# Patient Record
Sex: Female | Born: 1955 | Race: Black or African American | Hispanic: No | Marital: Single | Smoking: Former smoker
Health system: Southern US, Community
[De-identification: ages and names within clinical notes are randomized; demographics above are authoritative.]

## PROBLEM LIST (undated history)

## (undated) DIAGNOSIS — J45909 Unspecified asthma, uncomplicated: Secondary | ICD-10-CM

## (undated) DIAGNOSIS — G4734 Idiopathic sleep related nonobstructive alveolar hypoventilation: Secondary | ICD-10-CM

## (undated) DIAGNOSIS — G4733 Obstructive sleep apnea (adult) (pediatric): Secondary | ICD-10-CM

## (undated) DIAGNOSIS — I10 Essential (primary) hypertension: Secondary | ICD-10-CM

## (undated) DIAGNOSIS — R0600 Dyspnea, unspecified: Secondary | ICD-10-CM

## (undated) DIAGNOSIS — J449 Chronic obstructive pulmonary disease, unspecified: Secondary | ICD-10-CM

## (undated) DIAGNOSIS — K439 Ventral hernia without obstruction or gangrene: Secondary | ICD-10-CM

## (undated) HISTORY — PX: UTERINE FIBROID SURGERY: SHX826

## (undated) HISTORY — PX: ABDOMINAL HYSTERECTOMY: SHX81

---

## 2011-07-27 ENCOUNTER — Ambulatory Visit: Payer: Medicaid Other | Attending: Family Medicine | Admitting: Physical Therapy

## 2011-10-29 ENCOUNTER — Encounter (HOSPITAL_COMMUNITY): Payer: Self-pay | Admitting: Emergency Medicine

## 2011-10-29 ENCOUNTER — Emergency Department (HOSPITAL_COMMUNITY)
Admission: EM | Admit: 2011-10-29 | Discharge: 2011-10-29 | Disposition: A | Discharge status: Home / Self Care | Payer: Medicaid Other | Attending: Emergency Medicine | Admitting: Emergency Medicine

## 2011-10-29 DIAGNOSIS — E669 Obesity, unspecified: Secondary | ICD-10-CM | POA: Insufficient documentation

## 2011-10-29 DIAGNOSIS — N390 Urinary tract infection, site not specified: Secondary | ICD-10-CM

## 2011-10-29 DIAGNOSIS — Z87891 Personal history of nicotine dependence: Secondary | ICD-10-CM | POA: Insufficient documentation

## 2011-10-29 DIAGNOSIS — Z880 Allergy status to penicillin: Secondary | ICD-10-CM | POA: Insufficient documentation

## 2011-10-29 DIAGNOSIS — I10 Essential (primary) hypertension: Secondary | ICD-10-CM | POA: Insufficient documentation

## 2011-10-29 DIAGNOSIS — B86 Scabies: Secondary | ICD-10-CM | POA: Insufficient documentation

## 2011-10-29 HISTORY — DX: Essential (primary) hypertension: I10

## 2011-10-29 HISTORY — DX: Unspecified asthma, uncomplicated: J45.909

## 2011-10-29 HISTORY — DX: Chronic obstructive pulmonary disease, unspecified: J44.9

## 2011-10-29 LAB — URINALYSIS, ROUTINE W REFLEX MICROSCOPIC
Glucose, UA: NEGATIVE mg/dL
Nitrite: POSITIVE — AB
Protein, ur: >300 mg/dL — AB

## 2011-10-29 LAB — URINE MICROSCOPIC-ADD ON

## 2011-10-29 MED ORDER — DIPHENHYDRAMINE HCL 25 MG PO TABS: 25.0000 mg | ORAL_TABLET | Freq: Four times a day (QID) | ORAL | Status: DC

## 2011-10-29 MED ORDER — IBUPROFEN 800 MG PO TABS
800.0000 mg | ORAL_TABLET | Freq: Once | ORAL | Status: AC
  Administered 2011-10-29: 800 mg via ORAL
  Filled 2011-10-29: qty 1

## 2011-10-29 MED ORDER — CIPROFLOXACIN HCL 500 MG PO TABS: 500.0000 mg | ORAL_TABLET | Freq: Two times a day (BID) | ORAL | Status: AC

## 2011-10-29 MED ORDER — PERMETHRIN 5 % EX CREA: TOPICAL_CREAM | CUTANEOUS | Status: AC

## 2011-10-29 NOTE — ED Provider Notes (Signed)
History     CSN: [id number]  Arrival date & time 10/29/11  1609   First MD Initiated Contact with Patient 10/29/11 1854      Chief Complaint  Patient presents with  . Dysuria  . Pruritis    (Consider location/radiation/quality/duration/timing/severity/associated sxs/prior treatment) HPI Comments: 56 y/o female presents with itching from "scabies" on and off since February. States she was living in a shelter infested with scabies at the time, was treated and resolved, but has been seeing a man from the shelter who also has scabies. She randomly gets itchy in areas throughout her body including her hands, feet, web spaces, legs, chest, arms. States she sees a "pimple-like thing", scratches it, and continues to do this throughout the day. Also admits to dysuria with associated left sided low back pain, slight pelvic pressure and urgency for the past week. Denies any hematuria, increased frequency, fever, chills, vaginal pain, itching, discharge or odor, chest pain, sob.   Patient is a 56 y.o. female presenting with dysuria. The history is provided by the patient.  Dysuria  Associated symptoms include urgency. Pertinent negatives include no chills, no nausea, no vomiting, no frequency and no hematuria.    Past Medical History  Diagnosis Date  . Asthma   . COPD (chronic obstructive pulmonary disease)   . Hypertension     Past Surgical History  Procedure Date  . Abdominal hysterectomy   . Uterine fibroid surgery     No family history on file.  History  Substance Use Topics  . Smoking status: Former Games developer  . Smokeless tobacco: Not on file   Comment: quit 2008  . Alcohol Use: No    OB History    Grav Para Term Preterm Abortions TAB SAB Ect Mult Living                  Review of Systems  Constitutional: Negative for fever and chills.  Respiratory: Negative for shortness of breath.   Cardiovascular: Negative for chest pain.  Gastrointestinal: Negative for nausea and  vomiting.  Genitourinary: Positive for dysuria and urgency. Negative for frequency, hematuria, vaginal discharge and vaginal pain. Pelvic pain: pressure.  Musculoskeletal: Positive for back pain.  Skin:       Positive for itching    Allergies  Penicillins  Home Medications  No current outpatient prescriptions on file.  BP 151/68  Pulse 95  Temp 98.7 F (37.1 C) (Oral)  Resp 16  SpO2 97%  Physical Exam  Constitutional: She is oriented to person, place, and time. No distress.       Morbidly obese  HENT:  Head: Normocephalic and atraumatic.  Mouth/Throat: Oropharynx is clear and moist and mucous membranes are normal.  Eyes: Conjunctivae are normal. Pupils are equal, round, and reactive to light.  Neck: Neck supple.  Cardiovascular: Normal rate, regular rhythm and normal heart sounds.   Pulmonary/Chest: Effort normal and breath sounds normal.  Abdominal: Soft. Bowel sounds are normal. There is tenderness in the suprapubic area. There is no rigidity, no rebound and no guarding. CVA tenderness: mild on left.  Neurological: She is alert and oriented to person, place, and time.  Skin: Skin is warm and dry.       Multiple excoriations present on hands, arms, and legs from patient scratching. Few burrows present in web spaces of right hand. No evidence of secondary infection.  Psychiatric: She has a normal mood and affect. Her speech is normal and behavior is normal.    ED  Course  Procedures (including critical care time)   Labs Reviewed  URINALYSIS, ROUTINE W REFLEX MICROSCOPIC   Results for orders placed during the hospital encounter of 10/29/11  URINALYSIS, ROUTINE W REFLEX MICROSCOPIC      Component Value Range   Color, Urine YELLOW  YELLOW   APPearance CLOUDY (*) CLEAR   Specific Gravity, Urine 1.027  1.005 - 1.030   pH 6.0  5.0 - 8.0   Glucose, UA NEGATIVE  NEGATIVE mg/dL   Hgb urine dipstick MODERATE (*) NEGATIVE   Bilirubin Urine NEGATIVE  NEGATIVE   Ketones, ur  NEGATIVE  NEGATIVE mg/dL   Protein, ur >102 (*) NEGATIVE mg/dL   Urobilinogen, UA 1.0  0.0 - 1.0 mg/dL   Nitrite POSITIVE (*) NEGATIVE   Leukocytes, UA MODERATE (*) NEGATIVE  URINE MICROSCOPIC-ADD ON      Component Value Range   Squamous Epithelial / LPF MANY (*) RARE   WBC, UA 21-50  <3 WBC/hpf   RBC / HPF 11-20  <3 RBC/hpf   Bacteria, UA MANY (*) RARE   Casts HYALINE CASTS (*) NEGATIVE   Urine-Other MUCOUS PRESENT      No results found.   1. Scabies   2. Urinary tract infection       MDM  56 y/o female with scabies and UTI. Instructions to thoroughly clean all linens and clothing given. Rx permethrin and cipro.        Trevor Mace, PA-C 10/29/11 1950

## 2011-10-29 NOTE — ED Notes (Addendum)
Pt reports dsyuria, and lower back pain and lower abd pain; pt reports having itching, and hx of having scabies---reports feels the same as when she had scabies before; pt also reports having unprotected sex and having vaginal pain; denies abn. Discharge; no rash noted on pt

## 2011-10-29 NOTE — ED Notes (Signed)
Spoke with Johnnette Gourd PA in CDU, agreed to see pt in CDU

## 2011-10-29 NOTE — ED Notes (Signed)
Spoke with Dr Manus Gunning about pt--reports not candidate for FT d/t possible pelvic and would treat pt as scabies d/t hx of same and similar symptoms, and having sexual relations with man from homeless shelter

## 2011-10-30 NOTE — ED Provider Notes (Signed)
Medical screening examination/treatment/procedure(s) were performed by non-physician practitioner and as supervising physician I was immediately available for consultation/collaboration.  Merik Mignano, MD 10/30/11 0232 

## 2012-02-23 ENCOUNTER — Other Ambulatory Visit: Payer: Self-pay | Admitting: Internal Medicine

## 2012-02-23 DIAGNOSIS — Z1231 Encounter for screening mammogram for malignant neoplasm of breast: Secondary | ICD-10-CM

## 2012-03-10 ENCOUNTER — Other Ambulatory Visit: Payer: Self-pay | Admitting: Obstetrics and Gynecology

## 2012-03-10 ENCOUNTER — Ambulatory Visit: Payer: Medicaid Other

## 2012-04-07 ENCOUNTER — Ambulatory Visit: Payer: Medicaid Other

## 2012-04-28 ENCOUNTER — Encounter (HOSPITAL_COMMUNITY): Payer: Self-pay | Admitting: Cardiology

## 2012-04-28 ENCOUNTER — Emergency Department (HOSPITAL_COMMUNITY)
Admission: EM | Admit: 2012-04-28 | Discharge: 2012-04-28 | Disposition: A | Discharge status: Home / Self Care | Payer: Medicaid Other | Attending: Emergency Medicine | Admitting: Emergency Medicine

## 2012-04-28 ENCOUNTER — Emergency Department (HOSPITAL_COMMUNITY): Payer: Medicaid Other

## 2012-04-28 DIAGNOSIS — B86 Scabies: Secondary | ICD-10-CM | POA: Insufficient documentation

## 2012-04-28 DIAGNOSIS — J4489 Other specified chronic obstructive pulmonary disease: Secondary | ICD-10-CM | POA: Insufficient documentation

## 2012-04-28 DIAGNOSIS — Z87891 Personal history of nicotine dependence: Secondary | ICD-10-CM | POA: Insufficient documentation

## 2012-04-28 DIAGNOSIS — J449 Chronic obstructive pulmonary disease, unspecified: Secondary | ICD-10-CM | POA: Insufficient documentation

## 2012-04-28 DIAGNOSIS — I10 Essential (primary) hypertension: Secondary | ICD-10-CM | POA: Insufficient documentation

## 2012-04-28 DIAGNOSIS — L299 Pruritus, unspecified: Secondary | ICD-10-CM | POA: Insufficient documentation

## 2012-04-28 DIAGNOSIS — J45909 Unspecified asthma, uncomplicated: Secondary | ICD-10-CM | POA: Insufficient documentation

## 2012-04-28 DIAGNOSIS — R21 Rash and other nonspecific skin eruption: Secondary | ICD-10-CM | POA: Insufficient documentation

## 2012-04-28 DIAGNOSIS — R0600 Dyspnea, unspecified: Secondary | ICD-10-CM

## 2012-04-28 LAB — CBC
HCT: 36.8 % (ref 36.0–46.0)
Hemoglobin: 12.4 g/dL (ref 12.0–15.0)
MCH: 30.1 pg (ref 26.0–34.0)
MCV: 89.3 fL (ref 78.0–100.0)
Platelets: 204 10*3/uL (ref 150–400)
RBC: 4.12 MIL/uL (ref 3.87–5.11)

## 2012-04-28 LAB — BASIC METABOLIC PANEL
BUN: 14 mg/dL (ref 6–23)
CO2: 29 mEq/L (ref 19–32)
Calcium: 8.9 mg/dL (ref 8.4–10.5)
Creatinine, Ser: 0.95 mg/dL (ref 0.50–1.10)
Glucose, Bld: 93 mg/dL (ref 70–99)

## 2012-04-28 MED ORDER — HYDROXYZINE HCL 25 MG PO TABS: 25.0000 mg | ORAL_TABLET | Freq: Four times a day (QID) | ORAL | Status: DC

## 2012-04-28 MED ORDER — PERMETHRIN 5 % EX CREA
TOPICAL_CREAM | CUTANEOUS | Status: DC
Start: 2012-04-28 — End: 2012-06-24

## 2012-04-28 MED ORDER — ALBUTEROL SULFATE HFA 108 (90 BASE) MCG/ACT IN AERS: 2.0000 | INHALATION_SPRAY | Freq: Four times a day (QID) | RESPIRATORY_TRACT | Status: DC | PRN

## 2012-04-28 NOTE — ED Notes (Signed)
Pt reports SOB with hx of asthma that started this morning when she was walking to the car. Denies any current chest pain. Reports she used her inhaler without relief this morning. Reports she was dx with scabies about a year ago.

## 2012-04-28 NOTE — ED Provider Notes (Signed)
History     CSN: [id number]  Arrival date & time 04/28/12  4098   First MD Initiated Contact with Patient 04/28/12 1128      Chief Complaint  Patient presents with  . Shortness of Breath    (Consider location/radiation/quality/duration/timing/severity/associated sxs/prior treatment) HPI Comments: Patient states that she primarily comes to the Emergency Department due to concern that she may have Scabies.  She reports that both her niece and nephew who live with her were diagnosed with scabies last week.  She has been having intense itching of her elbows, back, groin, axilla, web spaces of the finger, and elbows.  She has noticed a rash on both of her elbows.  She has not tried any treatment prior to arrival.  She also states that she has a history of Asthma and has been feeling mildly short of breath.  She states that she has been feeling this way for months.  She has been told that this is most likely due to her morbid obesity.  She also has a history of asthma.  She states that she ran out of her Albuterol inhaler.  She did use her Advair inhaler this morning.  She currently does not smoke.  She denies any chest pain.  She states that she does not feel short of breath, but does become mildly short of breath with exertion.  She states that her shortness of breath does not feel any different today than it has over the past several months and that she primarily came in today because of concern for Scabies.  She denies fever or chills.  Denies cough or hemoptysis.  Denies wheezing.  Denies prolonged travel or surgeries in the past 4 weeks.  Denies any prior history of DVT or PE.  Denies swelling or pain of the LE bilaterally.    The history is provided by the patient.    Past Medical History  Diagnosis Date  . Asthma   . COPD (chronic obstructive pulmonary disease)   . Hypertension     Past Surgical History  Procedure Laterality Date  . Abdominal hysterectomy    . Uterine fibroid surgery       History reviewed. No pertinent family history.  History  Substance Use Topics  . Smoking status: Former Games developer  . Smokeless tobacco: Not on file     Comment: quit 2008  . Alcohol Use: No    OB History   Grav Para Term Preterm Abortions TAB SAB Ect Mult Living                  Review of Systems  Constitutional: Negative for fever and chills.  Respiratory: Positive for shortness of breath. Negative for cough and wheezing.   Cardiovascular: Negative for chest pain, palpitations and leg swelling.  Skin: Positive for rash.  All other systems reviewed and are negative.    Allergies  Penicillins  Home Medications   Current Outpatient Rx  Name  Route  Sig  Dispense  Refill  . EXPIRED: diphenhydrAMINE (BENADRYL) 25 MG tablet   Oral   Take 1 tablet (25 mg total) by mouth every 6 (six) hours.   20 tablet   0     BP 177/92  Pulse 59  Temp(Src) 97.7 F (36.5 C) (Oral)  SpO2 100%  Physical Exam  Nursing note and vitals reviewed. Constitutional: She appears well-developed and well-nourished. No distress.  Morbidly obese  HENT:  Head: Normocephalic and atraumatic.  Mouth/Throat: Oropharynx is clear and moist.  Neck: Normal range of motion. Neck supple.  Cardiovascular: Normal rate, regular rhythm and normal heart sounds.   Pulmonary/Chest: Effort normal and breath sounds normal. No accessory muscle usage. Not tachypneic. No respiratory distress. She has no decreased breath sounds. She has no wheezes. She has no rhonchi. She has no rales.  Musculoskeletal: Normal range of motion.  Neurological: She is alert.  Skin: Skin is warm and dry. Rash noted. She is not diaphoretic.  Erythematous papular pruritic rash located on the elbows bilaterally  Psychiatric: She has a normal mood and affect.    ED Course  Procedures (including critical care time)  Labs Reviewed  BASIC METABOLIC PANEL - Abnormal; Notable for the following:    GFR calc non Af Amer 67 (*)    GFR  calc Af Amer 77 (*)    All other components within normal limits  CBC  POCT I-STAT TROPONIN I   Dg Chest 2 View  04/28/2012  *RADIOLOGY REPORT*  Clinical Data: Shortness of breath, cough and asthma.  CHEST - 2 VIEW  Comparison: None.  Findings: Trachea is midline.  Heart is enlarged.  Pulmonary arteries appear prominent as well.  Lungs are clear.  No pleural fluid.  IMPRESSION: No acute findings.   Original Report Authenticated By: Leanna Battles, M.D.      No diagnosis found.  Date: 04/29/2012  Rate: 58  Rhythm: sinus bradycardia  QRS Axis: left  Intervals: normal  ST/T Wave abnormalities: nonspecific T wave changes  Conduction Disutrbances:none  Narrative Interpretation:   Old EKG Reviewed: none available     MDM  Patient with recent exposure to scabies presents today with a pruritic rash in the typical distribution of scabies.  Patient treated with Permethrin Cream.  She is also having some DOE.  However, this is nothing acute today.  She reports that she has been feeling this way for months and attributes it to her morbid obesity.  She primarily came to the ED for the rash.  No respiratory distress.  Pulse ox 100 on RA.  No tachypnea or tachycardia.  No chest pain.  Lungs CTAB.  No acute findings on CXR.  No ischemic changes on EKG.  Troponin negative.  Labs unremarkable.  Therefore, feel that patient is stable for discharge.  Strict return precautions given to the patient.          Pascal Lux [place], PA-C 04/29/12 1214

## 2012-05-02 NOTE — ED Provider Notes (Signed)
Medical screening examination/treatment/procedure(s) were performed by non-physician practitioner and as supervising physician I was immediately available for consultation/collaboration.   Shelda Jakes, MD 05/02/12 1154

## 2012-06-24 ENCOUNTER — Emergency Department (HOSPITAL_COMMUNITY): Payer: Medicaid Other

## 2012-06-24 ENCOUNTER — Encounter (HOSPITAL_COMMUNITY): Payer: Self-pay | Admitting: *Deleted

## 2012-06-24 ENCOUNTER — Emergency Department (HOSPITAL_COMMUNITY)
Admission: EM | Admit: 2012-06-24 | Discharge: 2012-06-24 | Disposition: A | Discharge status: Home / Self Care | Payer: Medicaid Other | Attending: Emergency Medicine | Admitting: Emergency Medicine

## 2012-06-24 DIAGNOSIS — R197 Diarrhea, unspecified: Secondary | ICD-10-CM | POA: Insufficient documentation

## 2012-06-24 DIAGNOSIS — J029 Acute pharyngitis, unspecified: Secondary | ICD-10-CM | POA: Insufficient documentation

## 2012-06-24 DIAGNOSIS — M791 Myalgia, unspecified site: Secondary | ICD-10-CM

## 2012-06-24 DIAGNOSIS — R05 Cough: Secondary | ICD-10-CM | POA: Insufficient documentation

## 2012-06-24 DIAGNOSIS — R0602 Shortness of breath: Secondary | ICD-10-CM

## 2012-06-24 DIAGNOSIS — IMO0002 Reserved for concepts with insufficient information to code with codable children: Secondary | ICD-10-CM | POA: Insufficient documentation

## 2012-06-24 DIAGNOSIS — J3489 Other specified disorders of nose and nasal sinuses: Secondary | ICD-10-CM | POA: Insufficient documentation

## 2012-06-24 DIAGNOSIS — Z87891 Personal history of nicotine dependence: Secondary | ICD-10-CM | POA: Insufficient documentation

## 2012-06-24 DIAGNOSIS — I10 Essential (primary) hypertension: Secondary | ICD-10-CM | POA: Insufficient documentation

## 2012-06-24 DIAGNOSIS — Z79899 Other long term (current) drug therapy: Secondary | ICD-10-CM | POA: Insufficient documentation

## 2012-06-24 DIAGNOSIS — J441 Chronic obstructive pulmonary disease with (acute) exacerbation: Secondary | ICD-10-CM | POA: Insufficient documentation

## 2012-06-24 DIAGNOSIS — R059 Cough, unspecified: Secondary | ICD-10-CM | POA: Insufficient documentation

## 2012-06-24 DIAGNOSIS — J45901 Unspecified asthma with (acute) exacerbation: Secondary | ICD-10-CM | POA: Insufficient documentation

## 2012-06-24 DIAGNOSIS — IMO0001 Reserved for inherently not codable concepts without codable children: Secondary | ICD-10-CM | POA: Insufficient documentation

## 2012-06-24 LAB — BASIC METABOLIC PANEL
BUN: 14 mg/dL (ref 6–23)
CO2: 31 mEq/L (ref 19–32)
Chloride: 101 mEq/L (ref 96–112)
Creatinine, Ser: 0.99 mg/dL (ref 0.50–1.10)
Potassium: 3.6 mEq/L (ref 3.5–5.1)

## 2012-06-24 LAB — POCT I-STAT, CHEM 8
BUN: 13 mg/dL (ref 6–23)
Hemoglobin: 14.3 g/dL (ref 12.0–15.0)
Potassium: 3.7 mEq/L (ref 3.5–5.1)
Sodium: 140 mEq/L (ref 135–145)
TCO2: 29 mmol/L (ref 0–100)

## 2012-06-24 LAB — CBC
HCT: 39.3 % (ref 36.0–46.0)
Hemoglobin: 13.6 g/dL (ref 12.0–15.0)
MCHC: 34.6 g/dL (ref 30.0–36.0)
MCV: 88.1 fL (ref 78.0–100.0)
RDW: 13.2 % (ref 11.5–15.5)

## 2012-06-24 MED ORDER — ALBUTEROL SULFATE (5 MG/ML) 0.5% IN NEBU
5.0000 mg | INHALATION_SOLUTION | Freq: Once | RESPIRATORY_TRACT | Status: AC
  Administered 2012-06-24: 5 mg via RESPIRATORY_TRACT
  Filled 2012-06-24: qty 1

## 2012-06-24 MED ORDER — BENZONATATE 100 MG PO CAPS
200.0000 mg | ORAL_CAPSULE | Freq: Once | ORAL | Status: AC
  Administered 2012-06-24: 200 mg via ORAL
  Filled 2012-06-24: qty 2

## 2012-06-24 MED ORDER — DIPHENOXYLATE-ATROPINE 2.5-0.025 MG PO TABS: 1.0000 | ORAL_TABLET | Freq: Four times a day (QID) | ORAL | Status: DC | PRN

## 2012-06-24 MED ORDER — ALBUTEROL SULFATE (5 MG/ML) 0.5% IN NEBU: 5.0000 mg | INHALATION_SOLUTION | Freq: Once | RESPIRATORY_TRACT | Status: AC

## 2012-06-24 MED ORDER — ONDANSETRON 4 MG PO TBDP: 4.0000 mg | ORAL_TABLET | Freq: Three times a day (TID) | ORAL | Status: DC | PRN

## 2012-06-24 MED ORDER — KETOROLAC TROMETHAMINE 60 MG/2ML IM SOLN
60.0000 mg | Freq: Once | INTRAMUSCULAR | Status: AC
  Administered 2012-06-24: 60 mg via INTRAMUSCULAR
  Filled 2012-06-24: qty 2

## 2012-06-24 MED ORDER — NAPROXEN 500 MG PO TABS: 500.0000 mg | ORAL_TABLET | Freq: Two times a day (BID) | ORAL | Status: DC

## 2012-06-24 MED ORDER — KETOROLAC TROMETHAMINE 30 MG/ML IJ SOLN
30.0000 mg | Freq: Once | INTRAMUSCULAR | Status: DC
  Filled 2012-06-24: qty 1

## 2012-06-24 MED ORDER — SODIUM CHLORIDE 0.9 % IV BOLUS (SEPSIS)
1000.0000 mL | INTRAVENOUS | Status: DC
Start: 2012-06-24 — End: 2012-06-25

## 2012-06-24 NOTE — ED Provider Notes (Signed)
[place] S 8:00 PM patient discussed in sign out with Dr. Hyacinth Meeker. Patient with history of COPD presenting with shortness of breath and cough. Unremarkable vital signs. Patient responding with good improvements to the initial breathing treatments in the emergency room. Basic labs pending. If unremarkable patient continues to be in stable condition may discharge home.  Labs and unremarkable. Patient continues to appear stable and improved after treatments previous in the emergency room. She is ready to return home. Will discharge at this time.    Results for orders placed during the hospital encounter of 06/24/12  BASIC METABOLIC PANEL      Result Value Range   Sodium 138  135 - 145 mEq/L   Potassium 3.6  3.5 - 5.1 mEq/L   Chloride 101  96 - 112 mEq/L   CO2 31  19 - 32 mEq/L   Glucose, Bld 104 (*) 70 - 99 mg/dL   BUN 14  6 - 23 mg/dL   Creatinine, Ser 1.61  0.50 - 1.10 mg/dL   Calcium 9.3  8.4 - 09.6 mg/dL   GFR calc non Af Amer 63 (*) >90 mL/min   GFR calc Af Amer 74 (*) >90 mL/min  CBC      Result Value Range   WBC 7.2  4.0 - 10.5 K/uL   RBC 4.46  3.87 - 5.11 MIL/uL   Hemoglobin 13.6  12.0 - 15.0 g/dL   HCT 04.5  40.9 - 81.1 %   MCV 88.1  78.0 - 100.0 fL   MCH 30.5  26.0 - 34.0 pg   MCHC 34.6  30.0 - 36.0 g/dL   RDW 91.4  78.2 - 95.6 %   Platelets 204  150 - 400 K/uL  POCT I-STAT, CHEM 8      Result Value Range   Sodium 140  135 - 145 mEq/L   Potassium 3.7  3.5 - 5.1 mEq/L   Chloride 103  96 - 112 mEq/L   BUN 13  6 - 23 mg/dL   Creatinine, Ser 2.13  0.50 - 1.10 mg/dL   Glucose, Bld 086 (*) 70 - 99 mg/dL   Calcium, Ion 5.78  4.69 - 1.23 mmol/L   TCO2 29  0 - 100 mmol/L   Hemoglobin 14.3  12.0 - 15.0 g/dL   HCT 62.9  52.8 - 41.3 %     Angus Seller, PA-C 06/25/12 [phone]

## 2012-06-24 NOTE — ED Provider Notes (Signed)
History     CSN: [id number]  Arrival date & time 06/24/12  4098   First MD Initiated Contact with Patient 06/24/12 1836      Chief Complaint  Patient presents with  . Shortness of Breath    (Consider location/radiation/quality/duration/timing/severity/associated sxs/prior treatment) HPI Comments: 57 year old female with a history of asthma and hypertension and morbid obesity who presents with a complaint of cough and shortness of breath. She states this has been present for approximately 3 days, gradual in onset, gradually getting worse and now associated with watery diarrhea, body aches, sore throat and nasal congestion. She denies fevers or chills and denies dysuria swelling or rashes of the skin. She has tried her albuterol inhaler at home with no improvement. She denies sick contacts  Patient is a 57 y.o. female presenting with shortness of breath. The history is provided by the patient.  Shortness of Breath   Past Medical History  Diagnosis Date  . Asthma   . COPD (chronic obstructive pulmonary disease)   . Hypertension     Past Surgical History  Procedure Laterality Date  . Abdominal hysterectomy    . Uterine fibroid surgery      No family history on file.  History  Substance Use Topics  . Smoking status: Former Games developer  . Smokeless tobacco: Not on file     Comment: quit 2008  . Alcohol Use: No    OB History   Grav Para Term Preterm Abortions TAB SAB Ect Mult Living                  Review of Systems  Respiratory: Positive for shortness of breath.   All other systems reviewed and are negative.    Allergies  Penicillins  Home Medications   Current Outpatient Rx  Name  Route  Sig  Dispense  Refill  . acetaminophen (TYLENOL) 325 MG tablet   Oral   Take 650 mg by mouth 2 (two) times daily as needed for pain.         Marland Kitchen albuterol (PROVENTIL HFA;VENTOLIN HFA) 108 (90 BASE) MCG/ACT inhaler   Inhalation   Inhale 2 puffs into the lungs every 6 (six)  hours as needed for wheezing.         Marland Kitchen Dextromethorphan-Guaifenesin (CORICIDIN HBP CONGESTION/COUGH) 10-200 MG CAPS   Oral   Take 1 capsule by mouth at bedtime as needed. For cold symptoms         . estrogen, conjugated,-medroxyprogesterone (PREMPRO) 0.625-2.5 MG per tablet   Oral   Take 1 tablet by mouth daily.         . Fluticasone-Salmeterol (ADVAIR) 250-50 MCG/DOSE AEPB   Inhalation   Inhale 1 puff into the lungs daily.         Marland Kitchen guaifenesin (ROBITUSSIN) 100 MG/5ML syrup   Oral   Take 200 mg by mouth 3 (three) times daily as needed for cough.         . hydrOXYzine (ATARAX/VISTARIL) 25 MG tablet   Oral   Take 25 mg by mouth every 6 (six) hours.         Marland Kitchen lisinopril (PRINIVIL,ZESTRIL) 40 MG tablet   Oral   Take 40 mg by mouth daily.         Marland Kitchen omeprazole (PRILOSEC) 40 MG capsule   Oral   Take 40 mg by mouth daily.         Marland Kitchen oxyCODONE-acetaminophen (PERCOCET) 10-325 MG per tablet   Oral   Take 1 tablet by  mouth every 4 (four) hours as needed for pain.         . diphenoxylate-atropine (LOMOTIL) 2.5-0.025 MG per tablet   Oral   Take 1 tablet by mouth 4 (four) times daily as needed for diarrhea or loose stools.   30 tablet   0   . naproxen (NAPROSYN) 500 MG tablet   Oral   Take 1 tablet (500 mg total) by mouth 2 (two) times daily with a meal.   30 tablet   0   . ondansetron (ZOFRAN ODT) 4 MG disintegrating tablet   Oral   Take 1 tablet (4 mg total) by mouth every 8 (eight) hours as needed for nausea.   10 tablet   0     BP 140/67  Pulse 70  Temp(Src) 99.3 F (37.4 C) (Oral)  Resp 21  SpO2 96%  Physical Exam  Nursing note and vitals reviewed. Constitutional: She appears well-developed and well-nourished. No distress.  HENT:  Head: Normocephalic and atraumatic.  Mouth/Throat: Oropharynx is clear and moist. No oropharyngeal exudate.  Pharynx is clear, tympanic membranes are clear bilaterally, nasal passages show swollen turbinates with  clear rhinorrhea  Eyes: Conjunctivae and EOM are normal. Pupils are equal, round, and reactive to light. Right eye exhibits no discharge. Left eye exhibits no discharge. No scleral icterus.  Neck: Normal range of motion. Neck supple. No JVD present. No thyromegaly present.  Supple neck, no lymphadenopathy or thyromegaly, no meningismus or stiffness  Cardiovascular: Normal rate, regular rhythm, normal heart sounds and intact distal pulses.  Exam reveals no gallop and no friction rub.   No murmur heard. Pulmonary/Chest: Effort normal and breath sounds normal. No respiratory distress. She has no wheezes. She has no rales.  Respiratory rate of 20, no wheezing, no rales, speaks in full sentences, no accessory muscle use, no apparent increased work of breathing  Abdominal: Soft. Bowel sounds are normal. She exhibits no distension and no mass. There is no tenderness.  Musculoskeletal: Normal range of motion. She exhibits no edema and no tenderness.  Lymphadenopathy:    She has no cervical adenopathy.  Neurological: She is alert. Coordination normal.  Skin: Skin is warm and dry. No rash noted. No erythema.  Psychiatric: She has a normal mood and affect. Her behavior is normal.    ED Course  Procedures (including critical care time)  Labs Reviewed  BASIC METABOLIC PANEL - Abnormal; Notable for the following:    Glucose, Bld 104 (*)    GFR calc non Af Amer 63 (*)    GFR calc Af Amer 74 (*)    All other components within normal limits  POCT I-STAT, CHEM 8 - Abnormal; Notable for the following:    Glucose, Bld 101 (*)    All other components within normal limits  CBC   Dg Chest 2 View (if Patient Has Fever And/or Copd)  06/24/2012  *RADIOLOGY REPORT*  Clinical Data: Shortness of breath  CHEST - 2 VIEW  Comparison: 04/28/2012  Findings: Chronic interstitial markings, including more focal opacity/scarring in the right upper lobe.  No pleural effusion or pneumothorax.  Cardiomegaly.  Mild  degenerative changes of the visualized thoracolumbar spine.  IMPRESSION: No evidence of acute cardiopulmonary disease.   Original Report Authenticated By: Charline Bills, M.D.      1. SOB (shortness of breath)   2. Diarrhea   3. Myalgia       MDM  The patient does appear to have some shortness of breath though her oxygen  saturations are normal, she is coughing occasionally, not tachycardic and not febrile. At this time the patient will undergo chest x-ray and laboratory workup though I suspect a viral process given multisystem involvement with overall stable vital signs. Toradol, IV fluids, reevaluate  ED ECG REPORT  I personally interpreted this EKG   Date: 06/24/2012   Rate: 71  Rhythm: sinus bradycardia  QRS Axis: left  Intervals: QRS prolonged  ST/T Wave abnormalities: normal  Conduction Disutrbances:nonspecific intraventricular conduction delay  Narrative Interpretation:   Old EKG Reviewed: Compared with 04/28/2012, no significant changes   Labs normal, pt stable appearing, viral syndrome likely.  Vida Roller, MD 06/24/12 [phone]

## 2012-06-24 NOTE — ED Notes (Signed)
Pt with cough, chills for several days.  Breathing x 3 with no relief.

## 2012-06-24 NOTE — ED Notes (Signed)
Pt given discharge paperwork.  No additional questions by pt regarding d/c.  VSS. Resps e/u. E-signature obtained.

## 2012-06-24 NOTE — ED Notes (Signed)
Attempted IV Access x2.  MD made aware.

## 2012-06-26 NOTE — ED Provider Notes (Signed)
Medical screening examination/treatment/procedure(s) were performed by non-physician practitioner and as supervising physician I was immediately available for consultation/collaboration.   Dione Booze, MD 06/26/12 [phone]

## 2012-11-13 ENCOUNTER — Encounter (HOSPITAL_COMMUNITY): Payer: Self-pay | Admitting: *Deleted

## 2012-11-13 ENCOUNTER — Emergency Department (HOSPITAL_COMMUNITY)
Admission: EM | Admit: 2012-11-13 | Discharge: 2012-11-13 | Disposition: A | Discharge status: Home / Self Care | Payer: Medicaid Other | Attending: Emergency Medicine | Admitting: Emergency Medicine

## 2012-11-13 DIAGNOSIS — Z880 Allergy status to penicillin: Secondary | ICD-10-CM | POA: Insufficient documentation

## 2012-11-13 DIAGNOSIS — M545 Low back pain, unspecified: Secondary | ICD-10-CM | POA: Insufficient documentation

## 2012-11-13 DIAGNOSIS — J4489 Other specified chronic obstructive pulmonary disease: Secondary | ICD-10-CM | POA: Insufficient documentation

## 2012-11-13 DIAGNOSIS — N390 Urinary tract infection, site not specified: Secondary | ICD-10-CM

## 2012-11-13 DIAGNOSIS — J449 Chronic obstructive pulmonary disease, unspecified: Secondary | ICD-10-CM | POA: Insufficient documentation

## 2012-11-13 DIAGNOSIS — Z87891 Personal history of nicotine dependence: Secondary | ICD-10-CM | POA: Insufficient documentation

## 2012-11-13 DIAGNOSIS — R300 Dysuria: Secondary | ICD-10-CM | POA: Insufficient documentation

## 2012-11-13 DIAGNOSIS — Z79899 Other long term (current) drug therapy: Secondary | ICD-10-CM | POA: Insufficient documentation

## 2012-11-13 DIAGNOSIS — I10 Essential (primary) hypertension: Secondary | ICD-10-CM | POA: Insufficient documentation

## 2012-11-13 DIAGNOSIS — R350 Frequency of micturition: Secondary | ICD-10-CM | POA: Insufficient documentation

## 2012-11-13 LAB — URINE MICROSCOPIC-ADD ON

## 2012-11-13 LAB — COMPREHENSIVE METABOLIC PANEL
ALT: 19 U/L (ref 0–35)
Alkaline Phosphatase: 88 U/L (ref 39–117)
CO2: 29 mEq/L (ref 19–32)
Chloride: 102 mEq/L (ref 96–112)
GFR calc Af Amer: 74 mL/min — ABNORMAL LOW (ref >90)
GFR calc non Af Amer: 64 mL/min — ABNORMAL LOW (ref >90)
Glucose, Bld: 110 mg/dL — ABNORMAL HIGH (ref 70–99)
Potassium: 3.1 mEq/L — ABNORMAL LOW (ref 3.5–5.1)
Sodium: 140 mEq/L (ref 135–145)

## 2012-11-13 LAB — URINALYSIS, ROUTINE W REFLEX MICROSCOPIC
Ketones, ur: 15 mg/dL — AB
Nitrite: POSITIVE — AB
Urobilinogen, UA: 2 mg/dL — ABNORMAL HIGH (ref 0.0–1.0)
pH: 6 (ref 5.0–8.0)

## 2012-11-13 LAB — CBC WITH DIFFERENTIAL/PLATELET
Lymphocytes Relative: 27 % (ref 12–46)
Lymphs Abs: 2 10*3/uL (ref 0.7–4.0)
Neutrophils Relative %: 62 % (ref 43–77)
Platelets: 237 10*3/uL (ref 150–400)
RBC: 4.06 MIL/uL (ref 3.87–5.11)
WBC: 7.3 10*3/uL (ref 4.0–10.5)

## 2012-11-13 MED ORDER — CEPHALEXIN 500 MG PO CAPS: 500.0000 mg | ORAL_CAPSULE | Freq: Four times a day (QID) | ORAL | Status: DC

## 2012-11-13 MED ORDER — PHENAZOPYRIDINE HCL 200 MG PO TABS: 200.0000 mg | ORAL_TABLET | Freq: Three times a day (TID) | ORAL | Status: DC | PRN

## 2012-11-13 NOTE — ED Notes (Signed)
Pt is here with lower back pain, lower abdominal pain and frequent urination for the last 4 days

## 2012-11-13 NOTE — ED Notes (Signed)
Patient brought to ED for possible UTI for about four days now. BP 184/97

## 2012-11-13 NOTE — ED Provider Notes (Signed)
CSN: [id number]     Arrival date & time 11/13/12  1328 History   First MD Initiated Contact with Patient 11/13/12 1341     Chief Complaint  Patient presents with  . Abdominal Pain  . Urinary Frequency  . Back Pain   (Consider location/radiation/quality/duration/timing/severity/associated sxs/prior Treatment) HPI Comments: Patient presents to the ER for evaluation of pain in the lower back and lower abdomen area. She has had increased urinary frequency and dysuria. No fever, nausea or vomiting. Patient reports that she had a similar episode about 9 months ago and was diagnosed with urinary tract infection. This feels similar. No chest pain or shortness of breath.  Patient is a 57 y.o. female presenting with abdominal pain, frequency, and back pain.  Abdominal Pain Urinary Frequency Associated symptoms include abdominal pain.  Back Pain Associated symptoms: abdominal pain     Past Medical History  Diagnosis Date  . Asthma   . COPD (chronic obstructive pulmonary disease)   . Hypertension    Past Surgical History  Procedure Laterality Date  . Abdominal hysterectomy    . Uterine fibroid surgery     No family history on file. History  Substance Use Topics  . Smoking status: Former Games developer  . Smokeless tobacco: Not on file     Comment: quit 2008  . Alcohol Use: No   OB History   Grav Para Term Preterm Abortions TAB SAB Ect Mult Living                 Review of Systems  Gastrointestinal: Positive for abdominal pain.  Genitourinary: Positive for frequency.  Musculoskeletal: Positive for back pain.  All other systems reviewed and are negative.    Allergies  Penicillins  Home Medications   Current Outpatient Rx  Name  Route  Sig  Dispense  Refill  . acetaminophen (TYLENOL) 325 MG tablet   Oral   Take 650 mg by mouth 2 (two) times daily as needed for pain.         Marland Kitchen albuterol (PROVENTIL HFA;VENTOLIN HFA) 108 (90 BASE) MCG/ACT inhaler   Inhalation   Inhale 2 puffs  into the lungs every 6 (six) hours as needed for wheezing.         Marland Kitchen estrogen, conjugated,-medroxyprogesterone (PREMPRO) 0.625-2.5 MG per tablet   Oral   Take 1 tablet by mouth daily.         . Fluticasone-Salmeterol (ADVAIR) 250-50 MCG/DOSE AEPB   Inhalation   Inhale 1 puff into the lungs daily.         Marland Kitchen lisinopril (PRINIVIL,ZESTRIL) 40 MG tablet   Oral   Take 40 mg by mouth daily.         . naproxen (NAPROSYN) 500 MG tablet   Oral   Take 1 tablet (500 mg total) by mouth 2 (two) times daily with a meal.   30 tablet   0   . omeprazole (PRILOSEC) 40 MG capsule   Oral   Take 40 mg by mouth daily.         Marland Kitchen oxyCODONE-acetaminophen (PERCOCET) 10-325 MG per tablet   Oral   Take 1 tablet by mouth every 4 (four) hours as needed for pain.          BP 167/69  Pulse 57  Temp(Src) 98 F (36.7 C) (Oral)  Resp 20  SpO2 96% Physical Exam  Constitutional: She is oriented to person, place, and time. She appears well-developed and well-nourished. No distress.  HENT:  Head: Normocephalic and  atraumatic.  Right Ear: Hearing normal.  Left Ear: Hearing normal.  Nose: Nose normal.  Mouth/Throat: Oropharynx is clear and moist and mucous membranes are normal.  Eyes: Conjunctivae and EOM are normal. Pupils are equal, round, and reactive to light.  Neck: Normal range of motion. Neck supple.  Cardiovascular: Regular rhythm, S1 normal and S2 normal.  Exam reveals no gallop and no friction rub.   No murmur heard. Pulmonary/Chest: Effort normal and breath sounds normal. No respiratory distress. She exhibits no tenderness.  Abdominal: Soft. Normal appearance and bowel sounds are normal. There is no hepatosplenomegaly. There is no tenderness. There is no rebound, no guarding, no tenderness at McBurney's point and negative Murphy's sign. No hernia.  Musculoskeletal: Normal range of motion.  Neurological: She is alert and oriented to person, place, and time. She has normal strength. No  cranial nerve deficit or sensory deficit. Coordination normal. GCS eye subscore is 4. GCS verbal subscore is 5. GCS motor subscore is 6.  Skin: Skin is warm, dry and intact. No rash noted. No cyanosis.  Psychiatric: She has a normal mood and affect. Her speech is normal and behavior is normal. Thought content normal.    ED Course  Procedures (including critical care time) Labs Review Labs Reviewed  COMPREHENSIVE METABOLIC PANEL - Abnormal; Notable for the following:    Potassium 3.1 (*)    Glucose, Bld 110 (*)    Albumin 3.1 (*)    Total Bilirubin 0.2 (*)    GFR calc non Af Amer 64 (*)    GFR calc Af Amer 74 (*)    All other components within normal limits  URINALYSIS, ROUTINE W REFLEX MICROSCOPIC - Abnormal; Notable for the following:    Color, Urine ORANGE (*)    Bilirubin Urine SMALL (*)    Ketones, ur 15 (*)    Urobilinogen, UA 2.0 (*)    Nitrite POSITIVE (*)    Leukocytes, UA SMALL (*)    All other components within normal limits  URINE MICROSCOPIC-ADD ON - Abnormal; Notable for the following:    Squamous Epithelial / LPF FEW (*)    All other components within normal limits  CBC WITH DIFFERENTIAL   Imaging Review No results found.  MDM  Diagnosis: Urinary tract infection  Patient presents to the ER for evaluation of back pain, urinary frequency and dysuria. Patient reports that she had similar symptoms previously with a urinary tract infection. Patient's abdominal exam is benign and nontender. Blood work is negative, urinalysis is c/w infection.   Gilda Crease, MD 11/13/12 [phone]

## 2013-10-30 ENCOUNTER — Emergency Department (HOSPITAL_COMMUNITY): Payer: No Typology Code available for payment source

## 2013-10-30 ENCOUNTER — Encounter (HOSPITAL_COMMUNITY): Payer: Self-pay | Admitting: Emergency Medicine

## 2013-10-30 ENCOUNTER — Emergency Department (HOSPITAL_COMMUNITY)
Admission: EM | Admit: 2013-10-30 | Discharge: 2013-10-30 | Disposition: A | Discharge status: Home / Self Care | Payer: No Typology Code available for payment source | Attending: Emergency Medicine | Admitting: Emergency Medicine

## 2013-10-30 DIAGNOSIS — Z79899 Other long term (current) drug therapy: Secondary | ICD-10-CM | POA: Insufficient documentation

## 2013-10-30 DIAGNOSIS — Y92410 Unspecified street and highway as the place of occurrence of the external cause: Secondary | ICD-10-CM | POA: Diagnosis not present

## 2013-10-30 DIAGNOSIS — S4980XA Other specified injuries of shoulder and upper arm, unspecified arm, initial encounter: Secondary | ICD-10-CM | POA: Insufficient documentation

## 2013-10-30 DIAGNOSIS — S40029A Contusion of unspecified upper arm, initial encounter: Secondary | ICD-10-CM | POA: Diagnosis not present

## 2013-10-30 DIAGNOSIS — J449 Chronic obstructive pulmonary disease, unspecified: Secondary | ICD-10-CM | POA: Insufficient documentation

## 2013-10-30 DIAGNOSIS — Y9389 Activity, other specified: Secondary | ICD-10-CM | POA: Diagnosis not present

## 2013-10-30 DIAGNOSIS — S0990XA Unspecified injury of head, initial encounter: Secondary | ICD-10-CM | POA: Diagnosis not present

## 2013-10-30 DIAGNOSIS — S99919A Unspecified injury of unspecified ankle, initial encounter: Secondary | ICD-10-CM

## 2013-10-30 DIAGNOSIS — S8990XA Unspecified injury of unspecified lower leg, initial encounter: Secondary | ICD-10-CM | POA: Diagnosis not present

## 2013-10-30 DIAGNOSIS — Z880 Allergy status to penicillin: Secondary | ICD-10-CM | POA: Insufficient documentation

## 2013-10-30 DIAGNOSIS — S46909A Unspecified injury of unspecified muscle, fascia and tendon at shoulder and upper arm level, unspecified arm, initial encounter: Secondary | ICD-10-CM | POA: Diagnosis present

## 2013-10-30 DIAGNOSIS — S79919A Unspecified injury of unspecified hip, initial encounter: Secondary | ICD-10-CM | POA: Insufficient documentation

## 2013-10-30 DIAGNOSIS — S79929A Unspecified injury of unspecified thigh, initial encounter: Secondary | ICD-10-CM

## 2013-10-30 DIAGNOSIS — I10 Essential (primary) hypertension: Secondary | ICD-10-CM | POA: Insufficient documentation

## 2013-10-30 DIAGNOSIS — T07XXXA Unspecified multiple injuries, initial encounter: Secondary | ICD-10-CM

## 2013-10-30 DIAGNOSIS — Z87891 Personal history of nicotine dependence: Secondary | ICD-10-CM | POA: Diagnosis not present

## 2013-10-30 DIAGNOSIS — J4489 Other specified chronic obstructive pulmonary disease: Secondary | ICD-10-CM | POA: Insufficient documentation

## 2013-10-30 DIAGNOSIS — S99929A Unspecified injury of unspecified foot, initial encounter: Secondary | ICD-10-CM

## 2013-10-30 MED ORDER — OXYCODONE-ACETAMINOPHEN 5-325 MG PO TABS: 1.0000 | ORAL_TABLET | ORAL | Status: DC | PRN

## 2013-10-30 MED ORDER — OXYCODONE-ACETAMINOPHEN 5-325 MG PO TABS
2.0000 | ORAL_TABLET | Freq: Once | ORAL | Status: AC
  Administered 2013-10-30: 2 via ORAL
  Filled 2013-10-30: qty 2

## 2013-10-30 NOTE — ED Notes (Signed)
Pt notified that she will be cathed in if she doesn't provide a urine specimen . EDP at bedside
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
## 2013-10-30 NOTE — ED Notes (Signed)
Pt urinated without catching sample, given water and will attempt to collect sample in about 30 minutes
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
## 2013-10-30 NOTE — Discharge Instructions (Signed)

## 2014-09-04 ENCOUNTER — Ambulatory Visit (HOSPITAL_BASED_OUTPATIENT_CLINIC_OR_DEPARTMENT_OTHER): Payer: Medicaid Other

## 2014-09-12 ENCOUNTER — Ambulatory Visit (HOSPITAL_BASED_OUTPATIENT_CLINIC_OR_DEPARTMENT_OTHER): Payer: Medicaid Other | Attending: Internal Medicine

## 2014-11-30 ENCOUNTER — Emergency Department (HOSPITAL_COMMUNITY)
Admission: EM | Admit: 2014-11-30 | Discharge: 2014-11-30 | Disposition: A | Discharge status: Home / Self Care | Payer: Medicaid Other | Attending: Emergency Medicine | Admitting: Emergency Medicine

## 2014-11-30 ENCOUNTER — Encounter (HOSPITAL_COMMUNITY): Payer: Self-pay | Admitting: Cardiology

## 2014-11-30 DIAGNOSIS — Y9289 Other specified places as the place of occurrence of the external cause: Secondary | ICD-10-CM | POA: Diagnosis not present

## 2014-11-30 DIAGNOSIS — Z23 Encounter for immunization: Secondary | ICD-10-CM | POA: Insufficient documentation

## 2014-11-30 DIAGNOSIS — X12XXXA Contact with other hot fluids, initial encounter: Secondary | ICD-10-CM | POA: Insufficient documentation

## 2014-11-30 DIAGNOSIS — Y9389 Activity, other specified: Secondary | ICD-10-CM | POA: Diagnosis not present

## 2014-11-30 DIAGNOSIS — Y998 Other external cause status: Secondary | ICD-10-CM | POA: Insufficient documentation

## 2014-11-30 DIAGNOSIS — Z7951 Long term (current) use of inhaled steroids: Secondary | ICD-10-CM | POA: Insufficient documentation

## 2014-11-30 DIAGNOSIS — Z880 Allergy status to penicillin: Secondary | ICD-10-CM | POA: Diagnosis not present

## 2014-11-30 DIAGNOSIS — T24212A Burn of second degree of left thigh, initial encounter: Secondary | ICD-10-CM | POA: Insufficient documentation

## 2014-11-30 DIAGNOSIS — Z79899 Other long term (current) drug therapy: Secondary | ICD-10-CM | POA: Diagnosis not present

## 2014-11-30 DIAGNOSIS — Z87891 Personal history of nicotine dependence: Secondary | ICD-10-CM | POA: Diagnosis not present

## 2014-11-30 DIAGNOSIS — T2124XA Burn of second degree of lower back, initial encounter: Secondary | ICD-10-CM

## 2014-11-30 DIAGNOSIS — I10 Essential (primary) hypertension: Secondary | ICD-10-CM | POA: Diagnosis not present

## 2014-11-30 DIAGNOSIS — J441 Chronic obstructive pulmonary disease with (acute) exacerbation: Secondary | ICD-10-CM | POA: Diagnosis not present

## 2014-11-30 MED ORDER — OXYCODONE-ACETAMINOPHEN 10-325 MG PO TABS: 1.0000 | ORAL_TABLET | ORAL | Status: AC | PRN

## 2014-11-30 MED ORDER — SILVER SULFADIAZINE 1 % EX CREA
TOPICAL_CREAM | Freq: Once | CUTANEOUS | Status: AC
  Administered 2014-11-30: 13:00:00 via TOPICAL
  Filled 2014-11-30: qty 85

## 2014-11-30 MED ORDER — OXYCODONE-ACETAMINOPHEN 5-325 MG PO TABS
2.0000 | ORAL_TABLET | Freq: Once | ORAL | Status: AC
  Administered 2014-11-30: 2 via ORAL
  Filled 2014-11-30: qty 2

## 2014-11-30 MED ORDER — TETANUS-DIPHTH-ACELL PERTUSSIS 5-2.5-18.5 LF-MCG/0.5 IM SUSP
0.5000 mL | Freq: Once | INTRAMUSCULAR | Status: AC
  Administered 2014-11-30: 0.5 mL via INTRAMUSCULAR
  Filled 2014-11-30: qty 0.5
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
## 2014-11-30 NOTE — ED Notes (Signed)
Nad, vss, watching tv, drinking coke.

## 2014-11-30 NOTE — ED Notes (Addendum)
MD at bedside. 

## 2014-11-30 NOTE — Discharge Instructions (Signed)
Burn Care Your skin is a natural barrier to infection. It is the largest organ of your body. Burns damage this natural protection. To help prevent infection, it is very important to follow your caregiver's instructions in the care of your burn. Burns are classified as:  First degree. There is only redness of the skin (erythema). No scarring is expected.  Second degree. There is blistering of the skin. Scarring may occur with deeper burns.  Third degree. All layers of the skin are injured, and scarring is expected. HOME CARE INSTRUCTIONS   Wash your hands well before changing your bandage.  Change your bandage as often as directed by your caregiver.  Remove the old bandage. If the bandage sticks, you may soak it off with cool, clean water.  Cleanse the burn thoroughly but gently with mild soap and water.  Pat the area dry with a clean, dry cloth.  Apply a thin layer of antibacterial cream to the burn.  Apply a clean bandage as instructed by your caregiver.  Keep the bandage as clean and dry as possible.  Elevate the affected area for the first 24 hours, then as instructed by your caregiver.  Only take over-the-counter or prescription medicines for pain, discomfort, or fever as directed by your caregiver. SEEK IMMEDIATE MEDICAL CARE IF:   You develop excessive pain.  You develop redness, tenderness, swelling, or red streaks near the burn.  The burned area develops yellowish-white fluid (pus) or a bad smell.  You have a fever. MAKE SURE YOU:   Understand these instructions.  Will watch your condition.  Will get help right away if you are not doing well or get worse. Document Released: 02/23/2005 Document Revised: 05/18/2011 Document Reviewed: 07/16/2010 Exit[employer] Patient Information 2015 [employer]. This information is not intended to replace advice given to you by your health care provider. Make sure you discuss any questions you have with your health care  provider.  
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
## 2015-05-01 ENCOUNTER — Other Ambulatory Visit: Payer: Self-pay

## 2015-05-01 ENCOUNTER — Other Ambulatory Visit: Payer: Self-pay | Admitting: Internal Medicine

## 2015-05-01 DIAGNOSIS — Z1231 Encounter for screening mammogram for malignant neoplasm of breast: Secondary | ICD-10-CM

## 2015-05-22 ENCOUNTER — Ambulatory Visit: Payer: Medicaid Other

## 2015-08-06 ENCOUNTER — Ambulatory Visit: Payer: Medicaid Other

## 2015-12-07 ENCOUNTER — Other Ambulatory Visit: Payer: Self-pay | Admitting: Internal Medicine

## 2015-12-07 DIAGNOSIS — Z1231 Encounter for screening mammogram for malignant neoplasm of breast: Secondary | ICD-10-CM

## 2015-12-26 ENCOUNTER — Other Ambulatory Visit (HOSPITAL_BASED_OUTPATIENT_CLINIC_OR_DEPARTMENT_OTHER): Payer: Self-pay

## 2015-12-26 DIAGNOSIS — G4730 Sleep apnea, unspecified: Secondary | ICD-10-CM

## 2015-12-26 DIAGNOSIS — G4700 Insomnia, unspecified: Secondary | ICD-10-CM

## 2015-12-26 DIAGNOSIS — G4710 Hypersomnia, unspecified: Secondary | ICD-10-CM

## 2015-12-26 DIAGNOSIS — R454 Irritability and anger: Secondary | ICD-10-CM

## 2015-12-26 DIAGNOSIS — R0683 Snoring: Secondary | ICD-10-CM

## 2015-12-27 ENCOUNTER — Ambulatory Visit (HOSPITAL_BASED_OUTPATIENT_CLINIC_OR_DEPARTMENT_OTHER): Payer: Medicaid Other | Attending: Internal Medicine | Admitting: Internal Medicine

## 2015-12-27 DIAGNOSIS — R454 Irritability and anger: Secondary | ICD-10-CM

## 2015-12-27 DIAGNOSIS — R0683 Snoring: Secondary | ICD-10-CM | POA: Diagnosis present

## 2015-12-27 DIAGNOSIS — R0902 Hypoxemia: Secondary | ICD-10-CM | POA: Diagnosis not present

## 2015-12-27 DIAGNOSIS — G4733 Obstructive sleep apnea (adult) (pediatric): Secondary | ICD-10-CM | POA: Diagnosis not present

## 2015-12-27 DIAGNOSIS — G4730 Sleep apnea, unspecified: Secondary | ICD-10-CM

## 2015-12-27 DIAGNOSIS — G4710 Hypersomnia, unspecified: Secondary | ICD-10-CM | POA: Diagnosis present

## 2015-12-27 DIAGNOSIS — G4700 Insomnia, unspecified: Secondary | ICD-10-CM

## 2016-01-04 DIAGNOSIS — R0683 Snoring: Secondary | ICD-10-CM | POA: Diagnosis not present

## 2016-01-04 NOTE — Procedures (Signed)
  Patient Name: Sierra Hampton, Danajah Study Date: 12/27/2015 Gender: Female D.O.B: Feb 12, 1956 Age (years): [age] Referring Provider: Rometta EmeryMohammad L Garba Height (inches): 68 Interpreting Physician: Jetty Duhamellinton Nevaen Tredway MD, ABSM Weight (lbs): 445 RPSGT: Rolene ArbourMcConnico, Yvonne BMI: 68 MRN: [id number] Neck Size: 18.00 CLINICAL INFORMATION Sleep Study Type: NPSG Indication for sleep study: Excessive Daytime Sleepiness, Snoring Epworth Sleepiness Score: 10  SLEEP STUDY TECHNIQUE As per the AASM Manual for the Scoring of Sleep and Associated Events v2.3 (April 2016) with a hypopnea requiring 4% desaturations. The channels recorded and monitored were frontal, central and occipital EEG, electrooculogram (EOG), submentalis EMG (chin), nasal and oral airflow, thoracic and abdominal wall motion, anterior tibialis EMG, snore microphone, electrocardiogram, and pulse oximetry.  MEDICATIONS Medications self-administered by patient taken the night of the study : none  SLEEP ARCHITECTURE The study was initiated at 10:41:29 PM and ended at 5:05:32 AM. Sleep onset time was 6.3 minutes and the sleep efficiency was 73.5%. The total sleep time was 282.2 minutes. Stage REM latency was 106.5 minutes. The patient spent 3.72% of the night in stage N1 sleep, 89.02% in stage N2 sleep, 0.00% in stage N3 and 7.26% in REM. Alpha intrusion was absent. Supine sleep was 38.62%.  RESPIRATORY PARAMETERS The overall apnea/hypopnea index (AHI) was 103.5 per hour. There were 268 total apneas, including 267 obstructive, 1 central and 0 mixed apneas. There were 219 hypopneas and 19 RERAs. The AHI during Stage REM sleep was 84.9 per hour. AHI while supine was 90.8 per hour. The mean oxygen saturation was 90.63%. The minimum SpO2 during sleep was 73.00%. Moderate snoring was noted during this study.  CARDIAC DATA The 2 lead EKG demonstrated sinus rhythm. The mean heart rate was 74.80 beats per minute. Other EKG findings include:  PVCs.  LEG MOVEMENT DATA The total PLMS were 0 with a resulting PLMS index of 0.00. Associated arousal with leg movement index was 0.0 .  IMPRESSIONS - Severe obstructive sleep apnea occurred during this study (AHI = 103.5/h). - No significant central sleep apnea occurred during this study (CAI = 0.2/h). - Awake after bathroom, unable to regain sleep for CPAP titration. - Moderate oxygen desaturation was noted during this study (Min O2 = 73.00%). - The patient snored with Moderate snoring volume. - EKG findings include PVCs. - Clinically significant periodic limb movements did not occur during sleep. No significant associated arousals. - Limited mobility- needed assistance. Complained of leg pains. Had slept several hours during the day before arrival at Sleep Center.  DIAGNOSIS - Obstructive Sleep Apnea (327.23 [G47.33 ICD-10]) - Nocturnal Hypoxemia (327.26 [G47.36 ICD-10])  RECOMMENDATIONS - Therapeutic CPAP titration to determine optimal pressure required to alleviate sleep disordered breathing. - Avoid alcohol, sedatives and other CNS depressants that may worsen sleep apnea and disrupt normal sleep architecture. - Sleep hygiene should be reviewed to assess factors that may improve sleep quality. - Weight management and regular exercise should be initiated or continued if appropriate.  [Electronically signed] 01/04/2016 11:31 AM  Jetty Duhamellinton Cameran Pettey MD, ABSM Diplomate, American Board of Sleep Medicine   NPI: [id number]  Waymon BudgeYOUNG,Ryer Asato D Diplomate, American Board of Sleep Medicine  ELECTRONICALLY SIGNED ON:  01/04/2016, 11:26 AM [facility] PH: [phone]   FX: [phone] ACCREDITED BY THE AMERICAN ACADEMY OF SLEEP MEDICINE

## 2016-02-02 ENCOUNTER — Emergency Department (HOSPITAL_COMMUNITY): Payer: Medicaid Other

## 2016-02-02 ENCOUNTER — Emergency Department (HOSPITAL_COMMUNITY)
Admission: EM | Admit: 2016-02-02 | Discharge: 2016-02-02 | Disposition: A | Discharge status: Home / Self Care | Payer: Medicaid Other | Attending: Emergency Medicine | Admitting: Emergency Medicine

## 2016-02-02 ENCOUNTER — Encounter (HOSPITAL_COMMUNITY): Payer: Self-pay

## 2016-02-02 DIAGNOSIS — S01312A Laceration without foreign body of left ear, initial encounter: Secondary | ICD-10-CM | POA: Diagnosis not present

## 2016-02-02 DIAGNOSIS — Y929 Unspecified place or not applicable: Secondary | ICD-10-CM | POA: Insufficient documentation

## 2016-02-02 DIAGNOSIS — Y939 Activity, unspecified: Secondary | ICD-10-CM | POA: Insufficient documentation

## 2016-02-02 DIAGNOSIS — Y999 Unspecified external cause status: Secondary | ICD-10-CM | POA: Insufficient documentation

## 2016-02-02 DIAGNOSIS — S51812A Laceration without foreign body of left forearm, initial encounter: Secondary | ICD-10-CM

## 2016-02-02 DIAGNOSIS — Z87891 Personal history of nicotine dependence: Secondary | ICD-10-CM | POA: Insufficient documentation

## 2016-02-02 DIAGNOSIS — I10 Essential (primary) hypertension: Secondary | ICD-10-CM | POA: Diagnosis not present

## 2016-02-02 DIAGNOSIS — S0990XA Unspecified injury of head, initial encounter: Secondary | ICD-10-CM | POA: Diagnosis present

## 2016-02-02 DIAGNOSIS — J449 Chronic obstructive pulmonary disease, unspecified: Secondary | ICD-10-CM | POA: Diagnosis not present

## 2016-02-02 DIAGNOSIS — Z79899 Other long term (current) drug therapy: Secondary | ICD-10-CM | POA: Diagnosis not present

## 2016-02-02 DIAGNOSIS — W06XXXA Fall from bed, initial encounter: Secondary | ICD-10-CM | POA: Insufficient documentation

## 2016-02-02 DIAGNOSIS — Z23 Encounter for immunization: Secondary | ICD-10-CM | POA: Insufficient documentation

## 2016-02-02 MED ORDER — TETANUS-DIPHTH-ACELL PERTUSSIS 5-2.5-18.5 LF-MCG/0.5 IM SUSP
0.5000 mL | Freq: Once | INTRAMUSCULAR | Status: AC
  Administered 2016-02-02: 0.5 mL via INTRAMUSCULAR
  Filled 2016-02-02: qty 0.5

## 2016-02-02 NOTE — ED Notes (Signed)
Patient transported to CT 

## 2016-02-02 NOTE — ED Triage Notes (Signed)
Pt here for fall from bed when fallling asleep sitting up on bed. Family is wanting to see if meds need to be adjusted.

## 2016-02-02 NOTE — ED Provider Notes (Signed)
[facility]-EMERGENCY DEPT Provider Note   CSN: [id number] Arrival date & time: 02/02/16  0335     History   Chief Complaint Chief Complaint  Patient presents with  . Fall    HPI Sierra Hampton is a 60 y.o. female.  She fell off of her bed while sitting up suffering injury to her left ear, left forearm, left shoulder. She suffered lacerations. She does not know when her last tetanus immunization was. Air was also consciousness. She denies headache, nausea, vomiting. She denies back, chest, abdomen injury. She denies right arm injury. She denies injury to either leg.   The history is provided by the patient and a relative.    Past Medical History:  Diagnosis Date  . Asthma   . COPD (chronic obstructive pulmonary disease) (HCC)   . Hypertension     There are no active problems to display for this patient.   Past Surgical History:  Procedure Laterality Date  . ABDOMINAL HYSTERECTOMY    . UTERINE FIBROID SURGERY      OB History    No data available       Home Medications    Prior to Admission medications   Medication Sig Start Date End Date Taking? Authorizing Provider  albuterol (PROVENTIL HFA;VENTOLIN HFA) 108 (90 BASE) MCG/ACT inhaler Inhale 2 puffs into the lungs every 6 (six) hours as needed for wheezing. 04/28/12   Santiago Glad, PA-C  albuterol (PROVENTIL) (2.5 MG/3ML) 0.083% nebulizer solution Take 2.5 mg by nebulization every 6 (six) hours as needed for wheezing or shortness of breath.    Historical Provider, MD  amLODipine (NORVASC) 10 MG tablet Take 10 mg by mouth daily.    Historical Provider, MD  escitalopram (LEXAPRO) 20 MG tablet Take 20 mg by mouth daily.    Historical Provider, MD  estrogen, conjugated,-medroxyprogesterone (PREMPRO) 0.625-2.5 MG per tablet Take 1 tablet by mouth daily.    Historical Provider, MD  Fluticasone-Salmeterol (ADVAIR) 250-50 MCG/DOSE AEPB Inhale 1 puff into the lungs daily.    Historical Provider, MD  hydrochlorothiazide  (HYDRODIURIL) 25 MG tablet Take 25 mg by mouth daily.    Historical Provider, MD  lisinopril (PRINIVIL,ZESTRIL) 40 MG tablet Take 40 mg by mouth daily.    Historical Provider, MD  omeprazole (PRILOSEC) 40 MG capsule Take 40 mg by mouth 2 (two) times daily.     Historical Provider, MD  oxyCODONE-acetaminophen (PERCOCET) 10-325 MG per tablet Take 1 tablet by mouth every 4 (four) hours as needed for pain. 11/30/14   Lorre Nick, MD  PRESCRIPTION MEDICATION Place 1 drop into both eyes daily. Eye drops for cataracts    Historical Provider, MD    Family History History reviewed. No pertinent family history.  Social History Social History  Substance Use Topics  . Smoking status: Former Games developer  . Smokeless tobacco: Not on file     Comment: quit 2008  . Alcohol use No     Allergies   Penicillins   Review of Systems Review of Systems  All other systems reviewed and are negative.    Physical Exam Updated Vital Signs BP 172/80 (BP Location: Right Arm)   Pulse 77   Temp 97.7 F (36.5 C) (Oral)   Resp 20   SpO2 94%   Physical Exam  Nursing note and vitals reviewed.  Morbidly obese 60 year old female, resting comfortably and in no acute distress. Vital signs are significant for hypertension. Oxygen saturation is 94%, which is normal. Head is normocephalic. Small laceration  is present on the superior aspect of the left ear. PERRLA, EOMI. Oropharynx is clear. Neck is nontender without adenopathy or JVD. Back is nontender and there is no CVA tenderness. Lungs are clear without rales, wheezes, or rhonchi. Chest is nontender. Heart has regular rate and rhythm without murmur. Abdomen is soft, flat, nontender without masses or hepatosplenomegaly and peristalsis is normoactive. Extremities have 2+ edema. Laceration is present in the proximal aspect of the left forearm. Vague tenderness is present are round the left shoulder. There is full passive range of motion of all joints without  obvious pain.. Skin is warm and dry without rash. Neurologic: Mental status is normal, cranial nerves are intact, there are no motor or sensory deficits.  ED Treatments / Results   Radiology Dg Forearm Left  Result Date: 02/02/2016 CLINICAL DATA:  Left forearm pain after a fall 1 day ago. EXAM: LEFT FOREARM - 2 VIEW COMPARISON:  None. FINDINGS: There is no evidence of fracture or other focal bone lesions. Soft tissues are unremarkable. IMPRESSION: Negative. Electronically Signed   By: Burman NievesWilliam  Stevens M.D.   On: 02/02/2016 05:08   Ct Head Wo Contrast  Result Date: 02/02/2016 CLINICAL DATA:  Patient states "she fell out of bed and hit her night stand, her head and neck hurt early they have resolved now" Patient extremely large just barely fits in the gentry EXAM: CT HEAD WITHOUT CONTRAST CT CERVICAL SPINE WITHOUT CONTRAST TECHNIQUE: Multidetector CT imaging of the head and cervical spine was performed following the standard protocol without intravenous contrast. Multiplanar CT image reconstructions of the cervical spine were also generated. COMPARISON:  None. FINDINGS: CT HEAD FINDINGS Brain: Ventricles are normal in size and configuration. Minimal chronic small vessel ischemic change noted within the deep periventricular white matter regions bilaterally. There is no mass, hemorrhage, edema or other evidence of acute parenchymal abnormality. No extra-axial hemorrhage. Vascular: No hyperdense vessel or unexpected calcification. Skull: Normal. Negative for fracture or focal lesion. Sinuses/Orbits: No acute finding. Other: None. CT CERVICAL SPINE FINDINGS Alignment: Slight reversal of the normal cervical lordosis. No evidence of acute subluxation. Skull base and vertebrae: Osseous detail is limited by beam attenuation related to body habitus, but there is no fracture line or displaced fracture fragment identified. Facet joints appear intact and normally aligned throughout. Soft tissues and spinal canal:  No prevertebral fluid or swelling. No visible canal hematoma. Disc levels: Mild disc desiccations throughout the mid and lower cervical spine, with associated mild disc space narrowings and osseous spurring. No more than mild central canal stenosis appreciated at any level. Upper chest: Unremarkable. Other: None IMPRESSION: 1. No acute intracranial findings. No intracranial mass, hemorrhage or edema. No skull fracture. 2. No fracture or acute subluxation identified within the cervical spine, with mild study limitations detailed above. Mild degenerative change within the mid and lower cervical spine. Electronically Signed   By: Bary RichardStan  Maynard M.D.   On: 02/02/2016 07:32   Ct Cervical Spine Wo Contrast  Result Date: 02/02/2016 CLINICAL DATA:  Patient states "she fell out of bed and hit her night stand, her head and neck hurt early they have resolved now" Patient extremely large just barely fits in the gentry EXAM: CT HEAD WITHOUT CONTRAST CT CERVICAL SPINE WITHOUT CONTRAST TECHNIQUE: Multidetector CT imaging of the head and cervical spine was performed following the standard protocol without intravenous contrast. Multiplanar CT image reconstructions of the cervical spine were also generated. COMPARISON:  None. FINDINGS: CT HEAD FINDINGS Brain: Ventricles are normal in size  and configuration. Minimal chronic small vessel ischemic change noted within the deep periventricular white matter regions bilaterally. There is no mass, hemorrhage, edema or other evidence of acute parenchymal abnormality. No extra-axial hemorrhage. Vascular: No hyperdense vessel or unexpected calcification. Skull: Normal. Negative for fracture or focal lesion. Sinuses/Orbits: No acute finding. Other: None. CT CERVICAL SPINE FINDINGS Alignment: Slight reversal of the normal cervical lordosis. No evidence of acute subluxation. Skull base and vertebrae: Osseous detail is limited by beam attenuation related to body habitus, but there is no  fracture line or displaced fracture fragment identified. Facet joints appear intact and normally aligned throughout. Soft tissues and spinal canal: No prevertebral fluid or swelling. No visible canal hematoma. Disc levels: Mild disc desiccations throughout the mid and lower cervical spine, with associated mild disc space narrowings and osseous spurring. No more than mild central canal stenosis appreciated at any level. Upper chest: Unremarkable. Other: None IMPRESSION: 1. No acute intracranial findings. No intracranial mass, hemorrhage or edema. No skull fracture. 2. No fracture or acute subluxation identified within the cervical spine, with mild study limitations detailed above. Mild degenerative change within the mid and lower cervical spine. Electronically Signed   By: Bary Richard M.D.   On: 02/02/2016 07:32   Dg Shoulder Left  Result Date: 02/02/2016 CLINICAL DATA:  Left shoulder pain after a fall 1 day ago. EXAM: LEFT SHOULDER - 2+ VIEW COMPARISON:  None. FINDINGS: There is no evidence of fracture or dislocation. There is no evidence of arthropathy or other focal bone abnormality. Soft tissues are unremarkable. IMPRESSION: Negative. Electronically Signed   By: Burman Nieves M.D.   On: 02/02/2016 05:07    Procedures Procedures (including critical care time) LACERATION REPAIR Performed by: ZOXWR,UEAVW Authorized by: [email or web address],BJYNW Consent: Verbal consent obtained. Risks and benefits: risks, benefits and alternatives were discussed Consent given by: patient Patient identity confirmed: provided demographic data Prepped and Draped in normal sterile fashion Wound explored  Laceration Location: left forearm  Laceration Length: 4.0 cm  No Foreign Bodies seen or palpated  Anesthesia: local infiltration  Local anesthetic: none  Amount of cleaning: standard  Skin closure: close  Number of staples: 5  Technique: surgical stapling  Patient tolerance: Patient tolerated the procedure  well with no immediate complications.   LACERATION REPAIR Performed by: GNFAO,ZHYQM Authorized by: VHQIO,NGEXB Consent: Verbal consent obtained. Risks and benefits: risks, benefits and alternatives were discussed Consent given by: patient Patient identity confirmed: provided demographic data Prepped and Draped in normal sterile fashion Wound explored  Laceration Location: left ear  Laceration Length: 1.0 cm  No Foreign Bodies seen or palpated  Anesthesia:   Amount of cleaning: standard  Skin closure: close  Technique: tissue adhesive  Patient tolerance: Patient tolerated the procedure well with no immediate complications.   Medications Ordered in ED Medications  Tdap (BOOSTRIX) injection 0.5 mL (not administered)     Initial Impression / Assessment and Plan / ED Course  I have reviewed the triage vital signs and the nursing notes.  Pertinent labs & imaging results that were available during my care of the patient were reviewed by me and considered in my medical decision making (see chart for details).  Clinical Course    Fall with lacerations to the left ear and left arm. Because of loss of consciousness, she will be sent for CT of head and also cervical spine. Plain x-rays are obtained of left shoulder and forearm.  X-rays and CT scans are unremarkable. Patient is noted to have significant  sleep apnea and I suspect that that actually is what the daughter noted rather than syncope. Lacerations are repaired and she is discharged home with instructions to follow-up with PCP.  Final Clinical Impressions(s) / ED Diagnoses   Final diagnoses:  Fall from bed, initial encounter  Laceration of left forearm, initial encounter  Laceration of left ear, initial encounter    New Prescriptions New Prescriptions   No medications on file     Dione Boozeavid Dorean Hiebert, MD 02/02/16 [phone]

## 2016-02-02 NOTE — ED Notes (Signed)
Report to Tribune Companykeisha rn

## 2016-02-02 NOTE — ED Notes (Signed)
Family is requesting resources or consult regarding possible alzheimers.

## 2016-04-06 ENCOUNTER — Other Ambulatory Visit (HOSPITAL_COMMUNITY): Payer: Self-pay | Admitting: Internal Medicine

## 2016-04-06 DIAGNOSIS — R1030 Lower abdominal pain, unspecified: Secondary | ICD-10-CM

## 2016-04-06 DIAGNOSIS — R1900 Intra-abdominal and pelvic swelling, mass and lump, unspecified site: Secondary | ICD-10-CM

## 2016-04-10 ENCOUNTER — Ambulatory Visit (HOSPITAL_COMMUNITY)
Admission: RE | Admit: 2016-04-10 | Discharge: 2016-04-10 | Disposition: A | Discharge status: Home / Self Care | Payer: Medicaid Other | Source: Ambulatory Visit | Attending: Internal Medicine | Admitting: Internal Medicine

## 2016-04-10 DIAGNOSIS — K439 Ventral hernia without obstruction or gangrene: Secondary | ICD-10-CM | POA: Insufficient documentation

## 2016-04-10 DIAGNOSIS — R1900 Intra-abdominal and pelvic swelling, mass and lump, unspecified site: Secondary | ICD-10-CM

## 2016-04-10 DIAGNOSIS — R1030 Lower abdominal pain, unspecified: Secondary | ICD-10-CM | POA: Diagnosis present

## 2016-04-10 MED ORDER — IOPAMIDOL (ISOVUE-300) INJECTION 61%
INTRAVENOUS | Status: AC
  Administered 2016-04-10: 100 mL
  Filled 2016-04-10: qty 100

## 2016-06-11 ENCOUNTER — Emergency Department (HOSPITAL_COMMUNITY): Payer: Medicaid Other

## 2016-06-11 ENCOUNTER — Encounter (HOSPITAL_COMMUNITY): Payer: Self-pay

## 2016-06-11 ENCOUNTER — Inpatient Hospital Stay (HOSPITAL_COMMUNITY)
Admission: EM | Admit: 2016-06-11 | Discharge: 2016-06-19 | DRG: 291 | Disposition: A | Discharge status: Home Health | Payer: Medicaid Other | Attending: Internal Medicine | Admitting: Internal Medicine

## 2016-06-11 DIAGNOSIS — N182 Chronic kidney disease, stage 2 (mild): Secondary | ICD-10-CM | POA: Diagnosis present

## 2016-06-11 DIAGNOSIS — Z79891 Long term (current) use of opiate analgesic: Secondary | ICD-10-CM

## 2016-06-11 DIAGNOSIS — I10 Essential (primary) hypertension: Secondary | ICD-10-CM

## 2016-06-11 DIAGNOSIS — R0600 Dyspnea, unspecified: Secondary | ICD-10-CM

## 2016-06-11 DIAGNOSIS — J9602 Acute respiratory failure with hypercapnia: Secondary | ICD-10-CM | POA: Diagnosis not present

## 2016-06-11 DIAGNOSIS — R4182 Altered mental status, unspecified: Secondary | ICD-10-CM

## 2016-06-11 DIAGNOSIS — G4733 Obstructive sleep apnea (adult) (pediatric): Secondary | ICD-10-CM | POA: Diagnosis not present

## 2016-06-11 DIAGNOSIS — R778 Other specified abnormalities of plasma proteins: Secondary | ICD-10-CM | POA: Diagnosis present

## 2016-06-11 DIAGNOSIS — I248 Other forms of acute ischemic heart disease: Secondary | ICD-10-CM | POA: Diagnosis present

## 2016-06-11 DIAGNOSIS — Z79899 Other long term (current) drug therapy: Secondary | ICD-10-CM | POA: Diagnosis not present

## 2016-06-11 DIAGNOSIS — I509 Heart failure, unspecified: Secondary | ICD-10-CM

## 2016-06-11 DIAGNOSIS — I5031 Acute diastolic (congestive) heart failure: Secondary | ICD-10-CM | POA: Diagnosis not present

## 2016-06-11 DIAGNOSIS — R7989 Other specified abnormal findings of blood chemistry: Secondary | ICD-10-CM

## 2016-06-11 DIAGNOSIS — Z9981 Dependence on supplemental oxygen: Secondary | ICD-10-CM

## 2016-06-11 DIAGNOSIS — R748 Abnormal levels of other serum enzymes: Secondary | ICD-10-CM

## 2016-06-11 DIAGNOSIS — J9621 Acute and chronic respiratory failure with hypoxia: Secondary | ICD-10-CM | POA: Diagnosis present

## 2016-06-11 DIAGNOSIS — Z6841 Body Mass Index (BMI) 40.0 and over, adult: Secondary | ICD-10-CM | POA: Diagnosis not present

## 2016-06-11 DIAGNOSIS — G4734 Idiopathic sleep related nonobstructive alveolar hypoventilation: Secondary | ICD-10-CM | POA: Insufficient documentation

## 2016-06-11 DIAGNOSIS — R0602 Shortness of breath: Secondary | ICD-10-CM

## 2016-06-11 DIAGNOSIS — J441 Chronic obstructive pulmonary disease with (acute) exacerbation: Secondary | ICD-10-CM | POA: Diagnosis present

## 2016-06-11 DIAGNOSIS — J9622 Acute and chronic respiratory failure with hypercapnia: Secondary | ICD-10-CM | POA: Diagnosis present

## 2016-06-11 DIAGNOSIS — Z8249 Family history of ischemic heart disease and other diseases of the circulatory system: Secondary | ICD-10-CM | POA: Diagnosis not present

## 2016-06-11 DIAGNOSIS — Z880 Allergy status to penicillin: Secondary | ICD-10-CM | POA: Diagnosis not present

## 2016-06-11 DIAGNOSIS — F329 Major depressive disorder, single episode, unspecified: Secondary | ICD-10-CM | POA: Diagnosis present

## 2016-06-11 DIAGNOSIS — R0902 Hypoxemia: Secondary | ICD-10-CM | POA: Diagnosis not present

## 2016-06-11 DIAGNOSIS — N179 Acute kidney failure, unspecified: Secondary | ICD-10-CM | POA: Diagnosis present

## 2016-06-11 DIAGNOSIS — J9600 Acute respiratory failure, unspecified whether with hypoxia or hypercapnia: Secondary | ICD-10-CM | POA: Diagnosis not present

## 2016-06-11 DIAGNOSIS — I110 Hypertensive heart disease with heart failure: Secondary | ICD-10-CM | POA: Diagnosis present

## 2016-06-11 DIAGNOSIS — I493 Ventricular premature depolarization: Secondary | ICD-10-CM | POA: Diagnosis present

## 2016-06-11 DIAGNOSIS — E669 Obesity, unspecified: Secondary | ICD-10-CM | POA: Diagnosis not present

## 2016-06-11 DIAGNOSIS — J9612 Chronic respiratory failure with hypercapnia: Secondary | ICD-10-CM | POA: Diagnosis not present

## 2016-06-11 DIAGNOSIS — Z87891 Personal history of nicotine dependence: Secondary | ICD-10-CM | POA: Diagnosis not present

## 2016-06-11 DIAGNOSIS — J9601 Acute respiratory failure with hypoxia: Secondary | ICD-10-CM | POA: Diagnosis not present

## 2016-06-11 DIAGNOSIS — J9610 Chronic respiratory failure, unspecified whether with hypoxia or hypercapnia: Secondary | ICD-10-CM | POA: Diagnosis present

## 2016-06-11 DIAGNOSIS — J9611 Chronic respiratory failure with hypoxia: Secondary | ICD-10-CM | POA: Diagnosis not present

## 2016-06-11 DIAGNOSIS — G8929 Other chronic pain: Secondary | ICD-10-CM | POA: Diagnosis present

## 2016-06-11 DIAGNOSIS — E662 Morbid (severe) obesity with alveolar hypoventilation: Secondary | ICD-10-CM | POA: Diagnosis present

## 2016-06-11 DIAGNOSIS — Z9119 Patient's noncompliance with other medical treatment and regimen: Secondary | ICD-10-CM | POA: Diagnosis not present

## 2016-06-11 HISTORY — DX: Dyspnea, unspecified: R06.00

## 2016-06-11 HISTORY — DX: Obstructive sleep apnea (adult) (pediatric): G47.33

## 2016-06-11 HISTORY — DX: Idiopathic sleep related nonobstructive alveolar hypoventilation: G47.34

## 2016-06-11 HISTORY — DX: Ventral hernia without obstruction or gangrene: K43.9

## 2016-06-11 LAB — COMPREHENSIVE METABOLIC PANEL
ALBUMIN: 3.5 g/dL (ref 3.5–5.0)
ALT: 30 U/L (ref 14–54)
AST: 29 U/L (ref 15–41)
Alkaline Phosphatase: 94 U/L (ref 38–126)
Anion gap: 9 (ref 5–15)
BUN: 11 mg/dL (ref 6–20)
CALCIUM: 8.8 mg/dL — AB (ref 8.9–10.3)
CHLORIDE: 104 mmol/L (ref 101–111)
CO2: 27 mmol/L (ref 22–32)
CREATININE: 1.06 mg/dL — AB (ref 0.44–1.00)
GFR calc non Af Amer: 57 mL/min — ABNORMAL LOW (ref >60)
GLUCOSE: 96 mg/dL (ref 65–99)
Potassium: 3.8 mmol/L (ref 3.5–5.1)
SODIUM: 140 mmol/L (ref 135–145)
Total Bilirubin: 0.5 mg/dL (ref 0.3–1.2)
Total Protein: 7.3 g/dL (ref 6.5–8.1)

## 2016-06-11 LAB — CBC
HCT: 38.7 % (ref 36.0–46.0)
HEMOGLOBIN: 11.9 g/dL — AB (ref 12.0–15.0)
MCH: 29 pg (ref 26.0–34.0)
MCHC: 30.7 g/dL (ref 30.0–36.0)
MCV: 94.4 fL (ref 78.0–100.0)
PLATELETS: 231 10*3/uL (ref 150–400)
RBC: 4.1 MIL/uL (ref 3.87–5.11)
RDW: 14.6 % (ref 11.5–15.5)
WBC: 8.5 10*3/uL (ref 4.0–10.5)

## 2016-06-11 LAB — URINALYSIS, ROUTINE W REFLEX MICROSCOPIC
Bilirubin Urine: NEGATIVE
GLUCOSE, UA: NEGATIVE mg/dL
Hgb urine dipstick: NEGATIVE
Ketones, ur: NEGATIVE mg/dL
Leukocytes, UA: NEGATIVE
Nitrite: NEGATIVE
PROTEIN: NEGATIVE mg/dL
Specific Gravity, Urine: 1.004 — ABNORMAL LOW (ref 1.005–1.030)
pH: 6 (ref 5.0–8.0)

## 2016-06-11 LAB — TROPONIN I
TROPONIN I: 0.03 ng/mL — AB (ref <0.03)
TROPONIN I: 0.03 ng/mL — AB (ref <0.03)
TROPONIN I: 0.09 ng/mL — AB (ref <0.03)
Troponin I: 0.05 ng/mL (ref <0.03)

## 2016-06-11 LAB — BRAIN NATRIURETIC PEPTIDE: B Natriuretic Peptide: 237.9 pg/mL — ABNORMAL HIGH (ref 0.0–100.0)

## 2016-06-11 LAB — HIV ANTIBODY (ROUTINE TESTING W REFLEX): HIV SCREEN 4TH GENERATION: NONREACTIVE

## 2016-06-11 MED ORDER — SODIUM CHLORIDE 0.9% FLUSH: 3.0000 mL | Freq: Two times a day (BID) | INTRAVENOUS | Status: DC

## 2016-06-11 MED ORDER — ESTROGENS CONJUGATED 0.625 MG PO TABS
0.6250 mg | ORAL_TABLET | Freq: Every day | ORAL | Status: DC
  Administered 2016-06-11 – 2016-06-19 (×9): 0.625 mg via ORAL
  Filled 2016-06-11 (×11): qty 1

## 2016-06-11 MED ORDER — OXYCODONE-ACETAMINOPHEN 10-325 MG PO TABS: 1.0000 | ORAL_TABLET | ORAL | Status: DC | PRN

## 2016-06-11 MED ORDER — PANTOPRAZOLE SODIUM 40 MG PO TBEC
40.0000 mg | DELAYED_RELEASE_TABLET | Freq: Every day | ORAL | Status: DC
  Administered 2016-06-11 – 2016-06-19 (×9): 40 mg via ORAL
  Filled 2016-06-11 (×9): qty 1

## 2016-06-11 MED ORDER — SODIUM CHLORIDE 0.9% FLUSH
3.0000 mL | Freq: Two times a day (BID) | INTRAVENOUS | Status: DC
  Administered 2016-06-11 – 2016-06-16 (×11): 3 mL via INTRAVENOUS
  Administered 2016-06-16: 10:00:00 via INTRAVENOUS

## 2016-06-11 MED ORDER — ALBUTEROL SULFATE (2.5 MG/3ML) 0.083% IN NEBU
5.0000 mg | INHALATION_SOLUTION | Freq: Once | RESPIRATORY_TRACT | Status: AC
  Administered 2016-06-11: 5 mg via RESPIRATORY_TRACT

## 2016-06-11 MED ORDER — SODIUM CHLORIDE 0.9 % IV SOLN: 250.0000 mL | INTRAVENOUS | Status: DC | PRN

## 2016-06-11 MED ORDER — OXYCODONE-ACETAMINOPHEN 5-325 MG PO TABS
1.0000 | ORAL_TABLET | ORAL | Status: DC | PRN
  Administered 2016-06-14: 1 via ORAL
  Filled 2016-06-11: qty 1

## 2016-06-11 MED ORDER — METHYLPREDNISOLONE SODIUM SUCC 40 MG IJ SOLR
40.0000 mg | Freq: Two times a day (BID) | INTRAMUSCULAR | Status: AC
Start: 2016-06-11 — End: 2016-06-12
  Administered 2016-06-11 – 2016-06-12 (×3): 40 mg via INTRAVENOUS
  Filled 2016-06-11 (×3): qty 1

## 2016-06-11 MED ORDER — ALBUTEROL (5 MG/ML) CONTINUOUS INHALATION SOLN
10.0000 mg/h | INHALATION_SOLUTION | Freq: Once | RESPIRATORY_TRACT | Status: AC
  Administered 2016-06-11: 10 mg/h via RESPIRATORY_TRACT
  Filled 2016-06-11: qty 20

## 2016-06-11 MED ORDER — CONJ ESTROG-MEDROXYPROGEST ACE 0.625-2.5 MG PO TABS: 1.0000 | ORAL_TABLET | Freq: Every day | ORAL | Status: DC

## 2016-06-11 MED ORDER — SODIUM CHLORIDE 0.9% FLUSH: 3.0000 mL | INTRAVENOUS | Status: DC | PRN

## 2016-06-11 MED ORDER — ALBUTEROL SULFATE (2.5 MG/3ML) 0.083% IN NEBU
INHALATION_SOLUTION | RESPIRATORY_TRACT | Status: AC
  Filled 2016-06-11: qty 6

## 2016-06-11 MED ORDER — SODIUM CHLORIDE 0.9% FLUSH
3.0000 mL | Freq: Two times a day (BID) | INTRAVENOUS | Status: DC
  Administered 2016-06-12 – 2016-06-16 (×4): 3 mL via INTRAVENOUS
  Administered 2016-06-17: 10 mL via INTRAVENOUS

## 2016-06-11 MED ORDER — FUROSEMIDE 10 MG/ML IJ SOLN
40.0000 mg | Freq: Once | INTRAMUSCULAR | Status: AC
  Administered 2016-06-11: 40 mg via INTRAVENOUS
  Filled 2016-06-11: qty 4

## 2016-06-11 MED ORDER — ACETAMINOPHEN 325 MG PO TABS
650.0000 mg | ORAL_TABLET | Freq: Four times a day (QID) | ORAL | Status: DC | PRN
  Administered 2016-06-11 – 2016-06-16 (×4): 650 mg via ORAL
  Filled 2016-06-11 (×4): qty 2

## 2016-06-11 MED ORDER — OXYCODONE HCL 5 MG PO TABS
5.0000 mg | ORAL_TABLET | ORAL | Status: DC | PRN
  Administered 2016-06-11 – 2016-06-14 (×4): 5 mg via ORAL
  Filled 2016-06-11 (×4): qty 1

## 2016-06-11 MED ORDER — FUROSEMIDE 10 MG/ML IJ SOLN
40.0000 mg | Freq: Two times a day (BID) | INTRAMUSCULAR | Status: AC
  Administered 2016-06-11 – 2016-06-12 (×3): 40 mg via INTRAVENOUS
  Filled 2016-06-11 (×3): qty 4

## 2016-06-11 MED ORDER — ALBUTEROL SULFATE (2.5 MG/3ML) 0.083% IN NEBU: 2.5000 mg | INHALATION_SOLUTION | Freq: Four times a day (QID) | RESPIRATORY_TRACT | Status: DC | PRN

## 2016-06-11 MED ORDER — ACETAMINOPHEN 650 MG RE SUPP: 650.0000 mg | Freq: Four times a day (QID) | RECTAL | Status: DC | PRN

## 2016-06-11 MED ORDER — ONDANSETRON HCL 4 MG/2ML IJ SOLN
4.0000 mg | Freq: Four times a day (QID) | INTRAMUSCULAR | Status: DC | PRN
  Administered 2016-06-12 – 2016-06-15 (×3): 4 mg via INTRAVENOUS
  Filled 2016-06-11 (×5): qty 2

## 2016-06-11 MED ORDER — ONDANSETRON HCL 4 MG/2ML IJ SOLN: 4.0000 mg | Freq: Four times a day (QID) | INTRAMUSCULAR | Status: DC | PRN

## 2016-06-11 MED ORDER — ONDANSETRON HCL 4 MG PO TABS
4.0000 mg | ORAL_TABLET | Freq: Four times a day (QID) | ORAL | Status: DC | PRN
  Administered 2016-06-12 – 2016-06-13 (×2): 4 mg via ORAL
  Filled 2016-06-11 (×2): qty 1

## 2016-06-11 MED ORDER — ACETAMINOPHEN 325 MG PO TABS: 650.0000 mg | ORAL_TABLET | ORAL | Status: DC | PRN

## 2016-06-11 MED ORDER — MEDROXYPROGESTERONE ACETATE 2.5 MG PO TABS
2.5000 mg | ORAL_TABLET | Freq: Every day | ORAL | Status: DC
  Administered 2016-06-11 – 2016-06-19 (×9): 2.5 mg via ORAL
  Filled 2016-06-11 (×11): qty 1

## 2016-06-11 MED ORDER — ALBUTEROL SULFATE (2.5 MG/3ML) 0.083% IN NEBU
2.5000 mg | INHALATION_SOLUTION | Freq: Three times a day (TID) | RESPIRATORY_TRACT | Status: DC
  Administered 2016-06-11 – 2016-06-14 (×10): 2.5 mg via RESPIRATORY_TRACT
  Filled 2016-06-11 (×10): qty 3

## 2016-06-11 MED ORDER — HYDROCHLOROTHIAZIDE 25 MG PO TABS: 25.0000 mg | ORAL_TABLET | Freq: Every day | ORAL | Status: DC

## 2016-06-11 MED ORDER — ENOXAPARIN SODIUM 100 MG/ML ~~LOC~~ SOLN
100.0000 mg | Freq: Every day | SUBCUTANEOUS | Status: DC
  Administered 2016-06-11 – 2016-06-19 (×9): 100 mg via SUBCUTANEOUS
  Filled 2016-06-11 (×11): qty 1

## 2016-06-11 MED ORDER — SENNOSIDES-DOCUSATE SODIUM 8.6-50 MG PO TABS
1.0000 | ORAL_TABLET | Freq: Every evening | ORAL | Status: DC | PRN
  Filled 2016-06-11 (×2): qty 1

## 2016-06-11 MED ORDER — MAGNESIUM SULFATE 2 GM/50ML IV SOLN
2.0000 g | Freq: Once | INTRAVENOUS | Status: AC
  Administered 2016-06-11: 2 g via INTRAVENOUS
  Filled 2016-06-11: qty 50

## 2016-06-11 MED ORDER — AMLODIPINE BESYLATE 10 MG PO TABS
10.0000 mg | ORAL_TABLET | Freq: Every day | ORAL | Status: DC
Start: 2016-06-11 — End: 2016-06-19
  Administered 2016-06-11 – 2016-06-19 (×9): 10 mg via ORAL
  Filled 2016-06-11 (×10): qty 1

## 2016-06-11 MED ORDER — ALBUTEROL SULFATE (2.5 MG/3ML) 0.083% IN NEBU
2.5000 mg | INHALATION_SOLUTION | RESPIRATORY_TRACT | Status: DC
  Administered 2016-06-11: 2.5 mg via RESPIRATORY_TRACT
  Filled 2016-06-11: qty 3

## 2016-06-11 MED ORDER — ESCITALOPRAM OXALATE 10 MG PO TABS
20.0000 mg | ORAL_TABLET | Freq: Every day | ORAL | Status: DC
  Administered 2016-06-11 – 2016-06-19 (×9): 20 mg via ORAL
  Filled 2016-06-11: qty 2
  Filled 2016-06-11: qty 1
  Filled 2016-06-11 (×2): qty 2
  Filled 2016-06-11 (×3): qty 1
  Filled 2016-06-11 (×2): qty 2
  Filled 2016-06-11: qty 1

## 2016-06-11 MED ORDER — METHYLPREDNISOLONE SODIUM SUCC 125 MG IJ SOLR
125.0000 mg | Freq: Once | INTRAMUSCULAR | Status: AC
  Administered 2016-06-11: 125 mg via INTRAVENOUS
  Filled 2016-06-11: qty 2

## 2016-06-11 NOTE — Progress Notes (Signed)
Nutrition Brief Note  RD consulted to assess nutrition needs/status per COPD GOLD protocol.   Wt Readings from Last 15 Encounters:  06/11/16 (!) 472 lb 10.7 oz (214.4 kg)  12/27/15 (!) 445 lb (201.9 kg)  11/30/14 (!) 415 lb (188.2 kg)  10/30/13 (!) 410 lb (186 kg)   Sierra Hampton is a 61 y.o. female who presents with Acute on chronic respiratory failure likely from a combination of Obesity hypoventilation syndrome, COPD exacerbation and possible new onset CHF. Consider initiating Doxy or Levaquin in am if not responding to diuresis, breathing treatments and steroids.   Pt admitted with COPD exacerbation.   Pt unavailable at time of visit. Noted progressive wt gain over the past 2 years. Per RN notes, pt with rt lower quadrant hernia that cannot be operated on until pt loses weight.   Pt is currently on a heart healthy/ carb modified diet, which provides approximately 1800 kcals daily based upon pt's food selections. While wt loss is not recommended in an acute hospital setting, pt may benefit from a bariatric referral as an outpatient to further address obesity and weight loss s/p hospitalization.   Body mass index is 71.87 kg/m. Patient meets criteria for extreme obesity, class III based on current BMI.   Current diet order is Heart Healthy/ carb Modified, patient is consuming approximately n/a% of meals at this time. Labs and medications reviewed.   No nutrition interventions warranted at this time. If nutrition issues arise, please consult RD.   Sharman Garrott A. Mayford Knife, RD, LDN, CDE Pager: [phone] After hours Pager: [phone]

## 2016-06-11 NOTE — Care Management Note (Addendum)
Case Management Note  Patient Details  Name: Sierra Hampton MRN: [id number] Date of Birth: [birth year]  Subjective/Objective:                  From home alone. /60 y.o. female with history of hypertension, COPD, and asthma who presents to the ED for evaluation of dyspnea. This patient states that she has been experiencing dyspnea over the last 3 days that has worsened and is now intolerable.  Action/Plan: Admit status INPATIENT (COPD exacerbation); anticipate discharge HOME WITH SELF CARE VS HOME HEALTH.  Addendum 4/6 09:50 Spoke with patient at the bedside. She states that she lives at home with her 31 year old daughter who provides her transportation to office visits. She uses [facility] and they deliver her medications to her. She states she has a HHA through Medicaid that provides 3-4 hours of care 2 days a week. She states that she gets her oxygen through [facility]. If patient requires oxygen for transport, please call [employer] at [phone]. Pta patient used O2 at night and PRN. Patient states she has a hospital bed, electric WC, shower seat at home.      Expected Discharge Date:   (unsure)               Expected Discharge Plan:  Home/Self Care  In-House Referral:  NA  Discharge planning Services  CM Consult  Post Acute Care Choice:    Choice offered to:     DME Arranged:    DME Agency:     HH Arranged:    HH Agency:     Status of Service:  In process, will continue to follow  If discussed at Long Length of Stay Meetings, dates discussed:    Additional Comments: Pt is on home oxygen at night and as needed.  Oletta Cohn, RN 06/11/2016, 8:18 AM

## 2016-06-11 NOTE — H&P (Signed)
History and Physical    Sierra Hampton NWG:[id number] DOB: April 11, 1955 DOA: 06/11/2016  PCP: Lonia Blood, MD Patient coming from: home  Chief Complaint: worsening sob  HPI: Sierra Hampton is a very pleasant 61 y.o. female with medical history significant of chronic respiratory failure related to asthma and COPD on home oxygen, obesity, hypertension, presents to emergency department with chief complaint of worsening shortness of breath. Initial evaluation reveals acute on chronic respiratory failure likely related to COPD exacerbation and possible mild acute heart failure.  Information is obtained from the patient. She states she is on 2 L of oxygen at home at night she uses nebulizers and inhalers. 3 days ago she developed gradual worsening shortness of breath. She used her inhalers and nebulizers with no improvement. She states moving about makes it worse. She denies headache dizziness syncope or near-syncope. She denies chest pain palpitation cough lower extremity edema. She denies abdominal pain nausea vomiting diarrhea constipation melena. She denies dysuria hematuria frequency or urgency. She reports she stopped smoking in 2011.   ED Course: In the emergency department she's afebrile hemodynamically stable with tachypnea and blood pressure slightly elevated. She has increased oxygen demand and audible wheezing. She is provided with Solu-Medrol and nebulizers as well as Lasix. At time of admission she reports improved respiratory effort  Review of Systems: As per HPI otherwise 10 point review of systems negative.   Ambulatory Status: No recent falls. Uses a cane or walker at home  Past Medical History:  Diagnosis Date  . Asthma   . COPD (chronic obstructive pulmonary disease) (HCC)   . Hypertension   . Nocturnal hypoxemia   . Obstructive sleep apnea   . Ventral hernia     Past Surgical History:  Procedure Laterality Date  . ABDOMINAL HYSTERECTOMY    . UTERINE FIBROID SURGERY        Social History   Social History  . Marital status: Single    Spouse name: N/A  . Number of children: N/A  . Years of education: N/A   Occupational History  . Not on file.   Social History Main Topics  . Smoking status: Former Games developer  . Smokeless tobacco: Never Used     Comment: quit 2008  . Alcohol use No  . Drug use: No  . Sexual activity: Not on file   Other Topics Concern  . Not on file   Social History Narrative  . No narrative on file    Allergies  Allergen Reactions  . Penicillins Hives    Family History  Problem Relation Age of Onset  . CAD Mother   . Diabetes Father     Prior to Admission medications   Medication Sig Start Date End Date Taking? Authorizing Provider  albuterol (PROVENTIL HFA;VENTOLIN HFA) 108 (90 BASE) MCG/ACT inhaler Inhale 2 puffs into the lungs every 6 (six) hours as needed for wheezing. 04/28/12  Yes Heather Laisure, PA-C  albuterol (PROVENTIL) (2.5 MG/3ML) 0.083% nebulizer solution Take 2.5 mg by nebulization every 6 (six) hours as needed for wheezing or shortness of breath.   Yes Historical Provider, MD  amLODipine (NORVASC) 10 MG tablet Take 10 mg by mouth daily.   Yes Historical Provider, MD  escitalopram (LEXAPRO) 20 MG tablet Take 20 mg by mouth daily.   Yes Historical Provider, MD  estrogen, conjugated,-medroxyprogesterone (PREMPRO) 0.625-2.5 MG per tablet Take 1 tablet by mouth daily.   Yes Historical Provider, MD  hydrochlorothiazide (HYDRODIURIL) 25 MG tablet Take 25 mg by mouth  daily.   Yes Historical Provider, MD  lisinopril (PRINIVIL,ZESTRIL) 40 MG tablet Take 40 mg by mouth daily.   Yes Historical Provider, MD  omeprazole (PRILOSEC) 40 MG capsule Take 40 mg by mouth 2 (two) times daily.    Yes Historical Provider, MD  oxyCODONE-acetaminophen (PERCOCET) 10-325 MG per tablet Take 1 tablet by mouth every 4 (four) hours as needed for pain. 11/30/14  Yes Lorre Nick, MD    Physical Exam: Vitals:   06/11/16 0415 06/11/16  0430 06/11/16 0547 06/11/16 0706  BP: (!) 177/74 (!) 160/80 (!) 148/73   Pulse: 87 89 86   Resp:   (!) 24   Temp:      TempSrc:      SpO2: 97% 97% 94%   Weight:    (!) 214.4 kg (472 lb 10.7 oz)  Height:    (1.727 m)      General:  Appears Slightly anxious morbidly obese in no acute distress Eyes:  PERRL, EOMI, normal lids, iris ENT:  grossly normal hearing, lips & tongue, his membranes of her mouth are slightly dry but pink Neck:  no LAD, masses or thyromegaly Cardiovascular:  RRR, no m/r/g. No LE edema.  Respiratory:  Moderate to severe increased work of breathing with conversation. Unable to complete sentences. Breath sounds are quite diminished but I do hear some air movement. no crackles wheeze Abdomen:  soft, ntnd, obese positive bowel sounds Skin:  no rash or induration seen on limited exam Musculoskeletal:  grossly normal tone BUE/BLE, good ROM, no bony abnormality Psychiatric:  grossly normal mood and affect, speech fluent and appropriate, AOx3 Neurologic:  CN 2-12 grossly intact, moves all extremities in coordinated fashion, sensation intact  Labs on Admission: I have personally reviewed following labs and imaging studies  CBC:  Recent Labs Lab 06/11/16 0110  WBC 8.5  HGB 11.9*  HCT 38.7  MCV 94.4  PLT 231   Basic Metabolic Panel:  Recent Labs Lab 06/11/16 0110  NA 140  K 3.8  CL 104  CO2 27  GLUCOSE 96  BUN 11  CREATININE 1.06*  CALCIUM 8.8*   GFR: Estimated Creatinine Clearance: 113.3 mL/min (A) (by C-G formula based on SCr of 1.06 mg/dL (H)). Liver Function Tests:  Recent Labs Lab 06/11/16 0110  AST 29  ALT 30  ALKPHOS 94  BILITOT 0.5  PROT 7.3  ALBUMIN 3.5   No results for input(s): LIPASE, AMYLASE in the last 168 hours. No results for input(s): AMMONIA in the last 168 hours. Coagulation Profile: No results for input(s): INR, PROTIME in the last 168 hours. Cardiac Enzymes:  Recent Labs Lab 06/11/16 0250  TROPONINI 0.05*    BNP (last 3 results) No results for input(s): PROBNP in the last 8760 hours. HbA1C: No results for input(s): HGBA1C in the last 72 hours. CBG: No results for input(s): GLUCAP in the last 168 hours. Lipid Profile: No results for input(s): CHOL, HDL, LDLCALC, TRIG, CHOLHDL, LDLDIRECT in the last 72 hours. Thyroid Function Tests: No results for input(s): TSH, T4TOTAL, FREET4, T3FREE, THYROIDAB in the last 72 hours. Anemia Panel: No results for input(s): VITAMINB12, FOLATE, FERRITIN, TIBC, IRON, RETICCTPCT in the last 72 hours. Urine analysis:    Component Value Date/Time   COLORURINE ORANGE (A) 11/13/2012 1410   APPEARANCEUR CLEAR 11/13/2012 1410   LABSPEC 1.024 11/13/2012 1410   PHURINE 6.0 11/13/2012 1410   GLUCOSEU NEGATIVE 11/13/2012 1410   HGBUR NEGATIVE 11/13/2012 1410   BILIRUBINUR SMALL (A) 11/13/2012 1410  KETONESUR 15 (A) 11/13/2012 1410   PROTEINUR NEGATIVE 11/13/2012 1410   UROBILINOGEN 2.0 (H) 11/13/2012 1410   NITRITE POSITIVE (A) 11/13/2012 1410   LEUKOCYTESUR SMALL (A) 11/13/2012 1410    Creatinine Clearance: Estimated Creatinine Clearance: 113.3 mL/min (A) (by C-G formula based on SCr of 1.06 mg/dL (H)).  Sepsis Labs: (procalcitonin:4,lacticidven:4) )No results found for this or any previous visit (from the past 240 hour(s)).   Radiological Exams on Admission: Dg Chest 2 View  Result Date: 06/11/2016 CLINICAL DATA:  Dyspnea tonight. EXAM: CHEST  2 VIEW COMPARISON:  10/30/2013 FINDINGS: Unchanged moderate cardiomegaly. Moderate vascular and interstitial prominence. No confluent airspace consolidation. No effusion. IMPRESSION: Vascular and interstitial prominence may represent mild congestive heart failure. No focal airspace consolidation. No effusion. Electronically Signed   By: Ellery Plunk M.D.   On: 06/11/2016 02:33    EKG: Independently reviewed. Sinus rhythm with marked sinus arrhythmia with occasional Premature ventricular complexes  Right atrial enlargement Left axis deviation Possible Anterior infarct , age undetermined Abnormal ECG  Assessment/Plan Principal Problem:   Acute respiratory failure (HCC) Active Problems:   COPD exacerbation (HCC)   Obesity   Chronic respiratory failure (HCC)   Hypertension   Acute kidney injury (HCC)   Acute heart failure (HCC)   Obstructive sleep apnea   #1. #1. Acute on chronic respiratory failure likely related to COPD exacerbation in the setting of possible mild acute CHF and morbid obesity. Patient's baseline 2 L oxygen nasal cannula at night as needed. Chest x-ray with vascular interstitial prominence may represent mild congestive heart or no focal airspace consolidation no effusion. She has increased oxygen demand with ambulation. She received nebulizers and Solu-Medrol and Lasix in the emergency department. At the time of admission continues with increased work of breathing but subjectively states she feels she is breathing better. -Admit to telemetry -Continue scheduled nebulizers -Low-dose Solu-Medrol -Continue Lasix -Obtain a 2-D echo -flutter valve  #2. COPD exacerbation. Patient with a history of COPD and asthma. She uses oxygen at 2 L at night as needed reportedly. chest x-ray as noted above.  -See #1 -Scheduled nebulizers -Solu-Medrol -Flutter valve -Likely benefit from outpatient pulmonary function test  #3. Acute heart failure. Denies any history of heart failure. BNP only slightly elevated. Chest x-ray represent mild CHF. -Continue IV Lasix -Monitor intake and output -Obtain daily weights -Hold lisinopril for now do to acute kidney injury plan to resume when able -Obtain a 2-D echo  #4. Acute kidney injury. Creatinine 1.06.  -Monitor urine output -Hold nephrotoxins as able -Recheck in the morning  #5. Elevated troponin. Likely demand. Denies chest pain. EKG as above. -Cycle troponins -Monitor on telemetry  #6. Hypertension. Only fair control in the  emergency department. Home medications include amlodipine, hydrochlorothiazide, lisinopril. -Continue amlodipine -Patient getting IV Lasix as noted above -Holding hydrochlorothiazide and lisinopril for now -Monitor closely  #7. Obesity. BMI 70.  -nutritional consult  #8. Obstructive sleep apnea. Review indicates had a sleep study in October 2017. Results noted obstructive sleep apnea and nocturnal hypoxemia. Note indicates moderate oxygen desaturation and EKG with PVCs. He recommended sleep apnea. Unclear of patient's compliance. -Respiratory therapy consult for CPap    DVT prophylaxis: lovenox  Code Status: full  Family Communication: none present  Disposition Plan: home  Consults called: nutritional consults  Admission status: inpatient    Gwenyth Bender MD [facility]  If 7PM-7AM, please contact night-coverage [email or web address] Password [email or web address]  06/11/2016, 7:55 AM

## 2016-06-11 NOTE — Evaluation (Signed)
Occupational Therapy Evaluation Patient Details Name: Sierra Hampton MRN: [id number] DOB: 1956/01/20 Today's Date: 06/11/2016    History of Present Illness 61 y.o. female with medical history significant of chronic respiratory failure related to asthma and COPD on home oxygen, obesity, hypertension, presents to emergency department with chief complaint of worsening shortness of breath.   Clinical Impression   Pt reports she required assist with ADL PTA. Currently pt requires min assist for functional mobility and min-max assist for ADL. Pt with DOE 2/4 with activity; SpO2 >90% on 2L supplemental O2. Pt planning to d/c home with near 24/7 supervision from family. Recommend HHOT and HH aide for follow up to maximize independence and safety with ADL and functional mobility upon return home. Pt would benefit from continued skilled OT to address established goals.    Follow Up Recommendations  Home health OT;Supervision/Assistance - 24 hour;Other (comment) (HH aide)    Equipment Recommendations  3 in 1 bedside commode;Tub/shower bench (bariatric equipment)    Recommendations for Other Services PT consult     Precautions / Restrictions Precautions Precautions: Fall Precaution Comments: watch O2 Restrictions Weight Bearing Restrictions: No      Mobility Bed Mobility Overal bed mobility: Needs Assistance Bed Mobility: Supine to Sit     Supine to sit: Min guard     General bed mobility comments: Min guard for safety.  Transfers Overall transfer level: Needs assistance Equipment used: Rolling walker (2 wheeled) Transfers: Sit to/from [employer] Sit to Stand: Min guard Stand pivot transfers: Min guard       General transfer comment: Min guard for safety. RW for sit to stand but not used for stand pivot    Balance Overall balance assessment: Needs assistance Sitting-balance support: Feet supported Sitting balance-Leahy Scale: Fair     Standing balance  support: Bilateral upper extremity supported Standing balance-Leahy Scale: Poor                             ADL either performed or assessed with clinical judgement   ADL Overall ADL's : Needs assistance/impaired Eating/Feeding: Set up;Sitting Eating/Feeding Details (indicate cue type and reason): set up with lunch at end of session Grooming: Set up;Supervision/safety;Sitting   Upper Body Bathing: Moderate assistance;Sitting   Lower Body Bathing: Maximal assistance;Sit to/from stand   Upper Body Dressing : Minimal assistance;Sitting   Lower Body Dressing: Maximal assistance;Sit to/from stand   Toilet Transfer: Min guard;Stand-pivot;BSC   Toileting- Architect and Hygiene: Total assistance;Sit to/from stand       Functional mobility during ADLs: Minimal assistance;Rolling walker General ADL Comments: DOE 2/4 with activity, SpO2 >90% thoughout on 2L O2.     Vision         Perception     Praxis      Pertinent Vitals/Pain Pain Assessment: Faces Faces Pain Scale: Hurts little more Pain Location: abdomen Pain Descriptors / Indicators: Sore Pain Intervention(s): Monitored during session;Repositioned     Hand Dominance     Extremity/Trunk Assessment Upper Extremity Assessment Upper Extremity Assessment: Overall WFL for tasks assessed   Lower Extremity Assessment Lower Extremity Assessment: Defer to PT evaluation       Communication Communication Communication: No difficulties   Cognition Arousal/Alertness: Awake/alert Behavior During Therapy: WFL for tasks assessed/performed Overall Cognitive Status: Within Functional Limits for tasks assessed  General Comments       Exercises     Shoulder Instructions      Home Living Family/patient expects to be discharged to:: Private residence Living Arrangements: Children (daughter) Available Help at Discharge: Family;Available 24  hours/day Type of Home: Apartment Home Access: Level entry     Home Layout: One level     Bathroom Shower/Tub: Tub/shower unit;Curtain   Firefighter: Standard     Home Equipment: Cane - single point          Prior Functioning/Environment Level of Independence: Needs assistance  Gait / Transfers Assistance Needed: ambulates short distances with cane ADL's / Homemaking Assistance Needed: daughter assists with bathing and dressing            OT Problem List: Decreased activity tolerance;Impaired balance (sitting and/or standing);Decreased knowledge of use of DME or AE;Cardiopulmonary status limiting activity;Obesity;Pain      OT Treatment/Interventions: Self-care/ADL training;Energy conservation;DME and/or AE instruction;Therapeutic activities;Patient/family education;Balance training    OT Goals(Current goals can be found in the care plan section) Acute Rehab OT Goals Patient Stated Goal: return home OT Goal Formulation: With patient Time For Goal Achievement: 06/25/16 Potential to Achieve Goals: Good ADL Goals Pt Will Perform Lower Body Bathing: with min assist;with adaptive equipment;sit to/from stand Pt Will Perform Lower Body Dressing: with min assist;with adaptive equipment;sit to/from stand Pt Will Transfer to Toilet: with supervision;ambulating;bedside commode (over toilet) Pt Will Perform Toileting - Clothing Manipulation and hygiene: with supervision;sit to/from stand;with adaptive equipment Pt Will Perform Tub/Shower Transfer: Tub transfer;ambulating;tub bench;rolling walker;with min guard assist Additional ADL Goal #1: Pt will independently verbalize 3 energy conservation and use during ADL.  OT Frequency: Min 2X/week   Barriers to D/C:            Co-evaluation              End of Session Equipment Utilized During Treatment: Rolling walker;Oxygen Nurse Communication: Mobility status;Other (comment) (BSC pinched pts R leg-small amount of  blood)  Activity Tolerance: Patient tolerated treatment well Patient left: in chair;with call bell/phone within reach  OT Visit Diagnosis: Unsteadiness on feet (R26.81);Other abnormalities of gait and mobility (R26.89)                Time: 1345-1404 OT Time Calculation (min): 19 min Charges:  OT General Charges $OT Visit: 1 Procedure OT Evaluation $OT Eval Moderate Complexity: 1 Procedure G-Codes:     Ruddy Swire A. Brett Albino, M.S., OTR/L Pager: [phone]  Gaye Alken 06/11/2016, 2:17 PM

## 2016-06-11 NOTE — ED Provider Notes (Signed)
[facility]-EMERGENCY DEPT Provider Note   CSN: [id number] Arrival date & time: 06/11/16  2130  By signing my name below, I, Elder Negus, attest that this documentation has been prepared under the direction and in the presence of Gilda Crease, MD. Electronically Signed: Elder Negus, Scribe. 06/11/16. 1:49 AM.   History   Chief Complaint Chief Complaint  Patient presents with  . Shortness of Breath    HPI Sierra Hampton is a 61 y.o. female with history of hypertension, COPD, and asthma who presents to the ED for evaluation of dyspnea. This patient states that she has been experiencing dyspnea over the last 3 days that has worsened and is now intolerable. She denies any chest pain or cough. She is on home oxygen at night and as needed. She states that she is taking breathing treatments at home which are only partially improving her complaints.   The history is provided by the patient. No language interpreter was used.    Past Medical History:  Diagnosis Date  . Asthma   . COPD (chronic obstructive pulmonary disease) (HCC)   . Hypertension     There are no active problems to display for this patient.   Past Surgical History:  Procedure Laterality Date  . ABDOMINAL HYSTERECTOMY    . UTERINE FIBROID SURGERY      OB History    No data available       Home Medications    Prior to Admission medications   Medication Sig Start Date End Date Taking? Authorizing Provider  albuterol (PROVENTIL HFA;VENTOLIN HFA) 108 (90 BASE) MCG/ACT inhaler Inhale 2 puffs into the lungs every 6 (six) hours as needed for wheezing. 04/28/12  Yes Heather Laisure, PA-C  albuterol (PROVENTIL) (2.5 MG/3ML) 0.083% nebulizer solution Take 2.5 mg by nebulization every 6 (six) hours as needed for wheezing or shortness of breath.   Yes Historical Provider, MD  amLODipine (NORVASC) 10 MG tablet Take 10 mg by mouth daily.   Yes Historical Provider, MD  escitalopram (LEXAPRO) 20 MG tablet Take 20  mg by mouth daily.   Yes Historical Provider, MD  estrogen, conjugated,-medroxyprogesterone (PREMPRO) 0.625-2.5 MG per tablet Take 1 tablet by mouth daily.   Yes Historical Provider, MD  hydrochlorothiazide (HYDRODIURIL) 25 MG tablet Take 25 mg by mouth daily.   Yes Historical Provider, MD  lisinopril (PRINIVIL,ZESTRIL) 40 MG tablet Take 40 mg by mouth daily.   Yes Historical Provider, MD  omeprazole (PRILOSEC) 40 MG capsule Take 40 mg by mouth 2 (two) times daily.    Yes Historical Provider, MD  oxyCODONE-acetaminophen (PERCOCET) 10-325 MG per tablet Take 1 tablet by mouth every 4 (four) hours as needed for pain. 11/30/14  Yes Lorre Nick, MD    Family History No family history on file.  Social History Social History  Substance Use Topics  . Smoking status: Former Games developer  . Smokeless tobacco: Never Used     Comment: quit 2008  . Alcohol use No     Allergies   Penicillins   Review of Systems Review of Systems  Respiratory: Positive for shortness of breath. Negative for cough.   Cardiovascular: Negative for chest pain.  All other systems reviewed and are negative.    Physical Exam Updated Vital Signs BP (!) 160/80   Pulse 89   Temp 98.1 F (36.7 C) (Oral)   Resp (!) 22   SpO2 97%   Physical Exam  Constitutional: She is oriented to person, place, and time. She appears well-developed and  well-nourished. No distress.  HENT:  Head: Normocephalic and atraumatic.  Right Ear: Hearing normal.  Left Ear: Hearing normal.  Nose: Nose normal.  Mouth/Throat: Oropharynx is clear and moist and mucous membranes are normal.  Eyes: Conjunctivae and EOM are normal. Pupils are equal, round, and reactive to light.  Neck: Normal range of motion. Neck supple.  Cardiovascular: Regular rhythm, S1 normal and S2 normal.  Exam reveals no gallop and no friction rub.   No murmur heard. Pulmonary/Chest: Effort normal. Tachypnea noted. No respiratory distress. She exhibits no tenderness.    Diminished breath sounds bilaterally.   Abdominal: Soft. Normal appearance and bowel sounds are normal. There is no hepatosplenomegaly. There is no tenderness. There is no rebound, no guarding, no tenderness at McBurney's point and negative Murphy's sign. No hernia.  Musculoskeletal: Normal range of motion.  Neurological: She is alert and oriented to person, place, and time. She has normal strength. No cranial nerve deficit or sensory deficit. Coordination normal. GCS eye subscore is 4. GCS verbal subscore is 5. GCS motor subscore is 6.  Skin: Skin is warm, dry and intact. No rash noted. No cyanosis.  Psychiatric: She has a normal mood and affect. Her speech is normal and behavior is normal. Thought content normal.  Nursing note and vitals reviewed.    ED Treatments / Results  Labs (all labs ordered are listed, but only abnormal results are displayed) Labs Reviewed  CBC - Abnormal; Notable for the following:       Result Value   Hemoglobin 11.9 (*)    All other components within normal limits  COMPREHENSIVE METABOLIC PANEL - Abnormal; Notable for the following:    Creatinine, Ser 1.06 (*)    Calcium 8.8 (*)    GFR calc non Af Amer 57 (*)    All other components within normal limits  BRAIN NATRIURETIC PEPTIDE - Abnormal; Notable for the following:    B Natriuretic Peptide 237.9 (*)    All other components within normal limits  TROPONIN I - Abnormal; Notable for the following:    Troponin I 0.05 (*)    All other components within normal limits    EKG  EKG Interpretation  Date/Time:  Thursday June 11 2016 01:01:59 EDT Ventricular Rate:  66 PR Interval:  182 QRS Duration: 106 QT Interval:  434 QTC Calculation: 454 R Axis:   -39 Text Interpretation:  Sinus rhythm with marked sinus arrhythmia with occasional Premature ventricular complexes Right atrial enlargement Left axis deviation Possible Anterior infarct , age undetermined Abnormal ECG No significant change since last  tracing Confirmed by Braxton Weisbecker  MD, Keron Koffman [phone]) on 06/11/2016 1:45:51 AM       Radiology Dg Chest 2 View  Result Date: 06/11/2016 CLINICAL DATA:  Dyspnea tonight. EXAM: CHEST  2 VIEW COMPARISON:  10/30/2013 FINDINGS: Unchanged moderate cardiomegaly. Moderate vascular and interstitial prominence. No confluent airspace consolidation. No effusion. IMPRESSION: Vascular and interstitial prominence may represent mild congestive heart failure. No focal airspace consolidation. No effusion. Electronically Signed   By: Ellery Plunk M.D.   On: 06/11/2016 02:33    Procedures Procedures (including critical care time)  Medications Ordered in ED Medications  furosemide (LASIX) injection 40 mg (not administered)  albuterol (PROVENTIL) (2.5 MG/3ML) 0.083% nebulizer solution 5 mg (5 mg Nebulization Given 06/11/16 0107)  methylPREDNISolone sodium succinate (SOLU-MEDROL) 125 mg/2 mL injection 125 mg (125 mg Intravenous Given 06/11/16 0214)  albuterol (PROVENTIL,VENTOLIN) solution continuous neb (10 mg/hr Nebulization Given 06/11/16 0235)  magnesium sulfate IVPB 2  g 50 mL (0 g Intravenous Stopped 06/11/16 0326)     Initial Impression / Assessment and Plan / ED Course  I have reviewed the triage vital signs and the nursing notes.  Pertinent labs & imaging results that were available during my care of the patient were reviewed by me and considered in my medical decision making (see chart for details).     Patient presents to the emergency department for evaluation of difficulty breathing. She reports that she has a history of asthma and COPD. She is on 2 L of oxygen by nasal cannula at home continuously. She reports that she has noticed worsening breathing. EMS report that she did have some mild wheezing. She was given albuterol with minimal improvement.  At arrival to the ER she was administered continuous albuterol treatment, Solu-Medrol, magnesium citrate. She reports that she is feeling better at rest.  She was unable, however, to stand at the bedside and take more than 1 or 2 steps without becoming short of breath and having her oxygen drop into the 86% range. Chest x-ray is suspicious for possible heart failure. BNP is slightly elevated. She does have a history of severe obstructive sleep apnea. Patient administered IV Lasix, will require hospitalization for further management.  Final Clinical Impressions(s) / ED Diagnoses   Final diagnoses:  Hypoxia    New Prescriptions New Prescriptions   No medications on file  I personally performed the services described in this documentation, which was scribed in my presence. The recorded information has been reviewed and is accurate.    Gilda Crease, MD 06/11/16 [phone]

## 2016-06-11 NOTE — ED Triage Notes (Signed)
Pt comes via [facility] for SOB x 2 days, hx of asthma and COPD. Pt normally wears 2L at home, SOB is worse with moving, speaks in short sentences after getting up, stats 94 on 4L, lung sound diminished on the R, wheezing on the left

## 2016-06-11 NOTE — ED Notes (Signed)
Patient states she is breathing much better

## 2016-06-11 NOTE — ED Notes (Signed)
Attempted to ambulate with port o2 . States she feels like she is close to her baseline. Only able to ambulate a very short distant before having to sit down. She spoke with her daughter aware of patient status. Upon ambulating sat down to 86%. Placed back on stretcher .

## 2016-06-11 NOTE — Progress Notes (Signed)
Report received from [place] at the ED. Pt arrived at the unit via bed at 0900.

## 2016-06-11 NOTE — ED Notes (Signed)
Report called to 5W 

## 2016-06-11 NOTE — Progress Notes (Addendum)
Sierra Hampton is a 61 y.o. female patient admitted from ED awake, alert - oriented  X 4 - no acute distress noted.  VSS - Blood pressure (!) 171/83, pulse 69, temperature 98.8 F (37.1 C), temperature source Oral, resp. rate 19, height  (1.727 m), weight (!) 214.4 kg (472 lb 10.7 oz), SpO2 94 %.    IV in place, occlusive dsg intact without redness.  Orientation to room, and floor completed with information packet given to patient/family.  Patient declined safety video at this time.  Admission INP armband ID verified with patient/family, and in place.   SR up x 2, fall assessment complete, with patient and family able to verbalize understanding of risk associated with falls, and verbalized understanding to call nsg before up out of bed.  Call light within reach, patient able to voice, and demonstrate understanding.  Skin, clean-dry- intact without evidence of bruising, or skin tears.   No evidence of skin break down noted on exam, except for some moles and indented mark on right side of the back.  Pt also mentioned that she was told by her physician  the swollen right lower  quadrant is hernia, and she needs to loose weight for anything to be done about it.     Will cont to eval and treat per MD orders.  Melvenia Needles, RN 06/11/2016 9:42 AM

## 2016-06-12 ENCOUNTER — Inpatient Hospital Stay (HOSPITAL_COMMUNITY): Payer: Medicaid Other

## 2016-06-12 DIAGNOSIS — R0600 Dyspnea, unspecified: Secondary | ICD-10-CM

## 2016-06-12 DIAGNOSIS — R0902 Hypoxemia: Secondary | ICD-10-CM

## 2016-06-12 DIAGNOSIS — Z6841 Body Mass Index (BMI) 40.0 and over, adult: Secondary | ICD-10-CM

## 2016-06-12 DIAGNOSIS — N179 Acute kidney failure, unspecified: Secondary | ICD-10-CM

## 2016-06-12 DIAGNOSIS — G4734 Idiopathic sleep related nonobstructive alveolar hypoventilation: Secondary | ICD-10-CM

## 2016-06-12 DIAGNOSIS — E669 Obesity, unspecified: Secondary | ICD-10-CM

## 2016-06-12 LAB — BASIC METABOLIC PANEL
ANION GAP: 11 (ref 5–15)
BUN: 19 mg/dL (ref 6–20)
CHLORIDE: 105 mmol/L (ref 101–111)
CO2: 25 mmol/L (ref 22–32)
Calcium: 9 mg/dL (ref 8.9–10.3)
Creatinine, Ser: 1.05 mg/dL — ABNORMAL HIGH (ref 0.44–1.00)
GFR calc Af Amer: >60 mL/min (ref >60)
GFR, EST NON AFRICAN AMERICAN: 57 mL/min — AB (ref >60)
GLUCOSE: 119 mg/dL — AB (ref 65–99)
POTASSIUM: 4 mmol/L (ref 3.5–5.1)
Sodium: 141 mmol/L (ref 135–145)

## 2016-06-12 LAB — CBC
HEMATOCRIT: 39.9 % (ref 36.0–46.0)
HEMOGLOBIN: 12.2 g/dL (ref 12.0–15.0)
MCH: 29.1 pg (ref 26.0–34.0)
MCHC: 30.6 g/dL (ref 30.0–36.0)
MCV: 95.2 fL (ref 78.0–100.0)
Platelets: 238 10*3/uL (ref 150–400)
RBC: 4.19 MIL/uL (ref 3.87–5.11)
RDW: 14.3 % (ref 11.5–15.5)
WBC: 13.2 10*3/uL — ABNORMAL HIGH (ref 4.0–10.5)

## 2016-06-12 NOTE — Progress Notes (Signed)
PROGRESS NOTE    Sierra Hampton  JXB:[id number] DOB: [birth year] DOA: 06/11/2016 PCP: Lonia Blood, MD   Chief Complaint  Patient presents with  . Shortness of Breath    Brief Narrative:  HPI On 06/11/2016 by Ms. [name], NP Sierra Hampton is a very pleasant 61 y.o. female with medical history significant of chronic respiratory failure related to asthma and COPD on home oxygen, obesity, hypertension, presents to emergency department with chief complaint of worsening shortness of breath. Initial evaluation reveals acute on chronic respiratory failure likely related to COPD exacerbation and possible mild acute heart failure.  Information is obtained from the patient. She states she is on 2 L of oxygen at home at night she uses nebulizers and inhalers. 3 days ago she developed gradual worsening shortness of breath. She used her inhalers and nebulizers with no improvement. She states moving about makes it worse. She denies headache dizziness syncope or near-syncope. She denies chest pain palpitation cough lower extremity edema. She denies abdominal pain nausea vomiting diarrhea constipation melena. She denies dysuria hematuria frequency or urgency. She reports she stopped smoking in 2011. Assessment & Plan   Acute on chronic hypoxic respiratory failure  -likely related to COPD exacerbation in the setting of possible mild acute CHF and morbid obesity -Patient's baseline 2 L oxygen nasal cannula at night as needed.  -Chest x-ray with vascular interstitial prominence may represent mild congestive heart or no focal airspace consolidation no effusion.  -She has increased oxygen demand with ambulation. -Continue Solu-Medrol, nebulizer treatments, Lasix, flutter valve -Pending echocardiogram to evaluate for possible CHF -BNP 237  COPD exacerbation -History of COPD and asthma -uses oxygen at 2 L at night as needed reportedly.  -chest x-ray as noted above -Continue treatment plan as  above -Likely benefit from outpatient pulmonary function test  Acute heart failure -No history of heart failure, BNP elevated to 37  -Chest x-ray as noted above  -Continue IV Lasix, monitor intake and output and daily weights  -Lisinopril currently held due to acute kidney injury  -Echocardiogram pending   Acute kidney injury versus CKD, stage II -Baseline creatinine 2014 was approximately 0.9, currently 1.05 -Continue to monitor BMP  Elevated troponin -Likely demand, troponin peaked at 0.09 -Denies chest pain  Essential Hypertension -Continue amlodipine -Lisinopril, HCTZ held due to possible AKI  Morbid Obesity -BMI 70 -nutritional consult  Obstructive sleep apnea  -Review indicates had a sleep study in October 2017. Results noted obstructive sleep apnea and nocturnal hypoxemia. Note indicates moderate oxygen desaturation and EKG with PVCs. He recommended sleep apnea. Unclear of patient's compliance. -Respiratory therapy consult for CPap  DVT Prophylaxis  lovenox  Code Status: Full  Family Communication: None at bedside  Disposition Plan: Admitted. Pending workup  Consultants None  Procedures  None  Antibiotics   Anti-infectives    None      Subjective:   Sierra Hampton seen and examined today.  Continues to feel short of breath however mildly improved. Does not feel she is back to her baseline. Currently denies any chest pain, abdominal pain, nausea or vomiting. Patient does endorse having multiple bowel movements.  Objective:   Vitals:   06/12/16 0016 06/12/16 0510 06/12/16 0853 06/12/16 1359  BP:  (!) 149/70    Pulse: 79 85    Resp:  18    Temp:  97.9 F (36.6 C)    TempSrc:  Oral    SpO2: 97% 97% 99% 98%  Weight:      Height:  Intake/Output Summary (Last 24 hours) at 06/12/16 1413 Last data filed at 06/12/16 1000  Gross per 24 hour  Intake              708 ml  Output                0 ml  Net              708 ml   Filed Weights    06/11/16 0706  Weight: (!) 214.4 kg (472 lb 10.7 oz)    Exam  General: Well developed, well nourished, NAD, appears stated age, Morbidly obese  HEENT: NCAT, membranes moist.   Neck: Supple, no masses. Unable to assess JVD due to body habitus  Cardiovascular: S1 S2 auscultated, no rubs, murmurs or gallops. Regular rate and rhythm.  Respiratory: Breath sounds diminished however no wheezing, mild increased work of breathing  Abdomen: Soft, Obese, nontender, nondistended, + bowel sounds  Extremities: warm dry without cyanosis clubbing or edema  Neuro: AAOx3, nonfocal  Psych: Normal affect and demeanor with intact judgement and insight   Data Reviewed: I have personally reviewed following labs and imaging studies  CBC:  Recent Labs Lab 06/11/16 0110 06/12/16 0926  WBC 8.5 13.2*  HGB 11.9* 12.2  HCT 38.7 39.9  MCV 94.4 95.2  PLT 231 238   Basic Metabolic Panel:  Recent Labs Lab 06/11/16 0110 06/12/16 0926  NA 140 141  K 3.8 4.0  CL 104 105  CO2 27 25  GLUCOSE 96 119*  BUN 11 19  CREATININE 1.06* 1.05*  CALCIUM 8.8* 9.0   GFR: Estimated Creatinine Clearance: 114.4 mL/min (A) (by C-G formula based on SCr of 1.05 mg/dL (H)). Liver Function Tests:  Recent Labs Lab 06/11/16 0110  AST 29  ALT 30  ALKPHOS 94  BILITOT 0.5  PROT 7.3  ALBUMIN 3.5   No results for input(s): LIPASE, AMYLASE in the last 168 hours. No results for input(s): AMMONIA in the last 168 hours. Coagulation Profile: No results for input(s): INR, PROTIME in the last 168 hours. Cardiac Enzymes:  Recent Labs Lab 06/11/16 0250 06/11/16 0724 06/11/16 1233 06/11/16 1840  TROPONINI 0.05* 0.03* 0.03* 0.09*   BNP (last 3 results) No results for input(s): PROBNP in the last 8760 hours. HbA1C: No results for input(s): HGBA1C in the last 72 hours. CBG: No results for input(s): GLUCAP in the last 168 hours. Lipid Profile: No results for input(s): CHOL, HDL, LDLCALC, TRIG, CHOLHDL,  LDLDIRECT in the last 72 hours. Thyroid Function Tests: No results for input(s): TSH, T4TOTAL, FREET4, T3FREE, THYROIDAB in the last 72 hours. Anemia Panel: No results for input(s): VITAMINB12, FOLATE, FERRITIN, TIBC, IRON, RETICCTPCT in the last 72 hours. Urine analysis:    Component Value Date/Time   COLORURINE COLORLESS (A) 06/11/2016 0810   APPEARANCEUR CLEAR 06/11/2016 0810   LABSPEC 1.004 (L) 06/11/2016 0810   PHURINE 6.0 06/11/2016 0810   GLUCOSEU NEGATIVE 06/11/2016 0810   HGBUR NEGATIVE 06/11/2016 0810   BILIRUBINUR NEGATIVE 06/11/2016 0810   KETONESUR NEGATIVE 06/11/2016 0810   PROTEINUR NEGATIVE 06/11/2016 0810   UROBILINOGEN 2.0 (H) 11/13/2012 1410   NITRITE NEGATIVE 06/11/2016 0810   LEUKOCYTESUR NEGATIVE 06/11/2016 0810   Sepsis Labs: (procalcitonin:4,lacticidven:4)  )No results found for this or any previous visit (from the past 240 hour(s)).    Radiology Studies: Dg Chest 2 View  Result Date: 06/11/2016 CLINICAL DATA:  Dyspnea tonight. EXAM: CHEST  2 VIEW COMPARISON:  10/30/2013 FINDINGS: Unchanged moderate cardiomegaly. Moderate vascular  and interstitial prominence. No confluent airspace consolidation. No effusion. IMPRESSION: Vascular and interstitial prominence may represent mild congestive heart failure. No focal airspace consolidation. No effusion. Electronically Signed   By: Ellery Plunk M.D.   On: 06/11/2016 02:33     Scheduled Meds: . albuterol  2.5 mg Nebulization TID  . amLODipine  10 mg Oral Daily  . enoxaparin (LOVENOX) injection  100 mg Subcutaneous Daily  . escitalopram  20 mg Oral Daily  . estrogens (conjugated)  0.625 mg Oral Daily   And  . medroxyPROGESTERone  2.5 mg Oral Daily  . furosemide  40 mg Intravenous BID  . pantoprazole  40 mg Oral Daily  . sodium chloride flush  3 mL Intravenous Q12H  . sodium chloride flush  3 mL Intravenous Q12H   Continuous Infusions:   LOS: 1 day   Time Spent in minutes   30  minutes  Sierra Hampton D.O. on 06/12/2016 at 2:13 PM  Between 7am to 7pm - Pager - [phone]  After 7pm go to [email or web address] - password [email or web address]  And look for the night coverage person covering for me after hours  [facility] Office  [phone]

## 2016-06-12 NOTE — Evaluation (Signed)
Physical Therapy Evaluation Patient Details Name: Sierra Hampton MRN: [id number] DOB: 1955/11/26 Today's Date: 06/12/2016   History of Present Illness  Sierra Hampton a very pleasant 61 y.o.femalewith medical history significant of chronic respiratory failure related to asthma and COPD on home oxygen, obesity, hypertension, presents to emergency department with chief complaint of worsening shortness of breath. Initial evaluation reveals acute on chronic respiratory failure likely related to COPD exacerbation and possible mild acute heart failure.PMH:  COPD, CHF, obesity,HTN, and OSA.   Clinical Impression  Pt admitted with above diagnosis. Pt currently with functional limitations due to the deficits listed below (see PT Problem List). Pt would only come to EOB.  Pt moves well and does not need assist. Nurse states pt able to get on and off toilet on her own.  On arrival, pt had removed her O2.  Sats 78% on RA.  Replaced O2 and it took 5 min for O2 to come to 90% and >.  Nurse aware and pt informed that she needs to keep her O2 on at all times.  Feel that pt will be able to go home with daughter with 24 hour care and HH f/u. Pt will benefit from skilled PT to increase their independence and safety with mobility to allow discharge to the venue listed below.      Follow Up Recommendations Home health PT;Supervision/Assistance - 24 hour    Equipment Recommendations  None recommended by PT    Recommendations for Other Services       Precautions / Restrictions Precautions Precautions: Fall Precaution Comments: watch O2 Restrictions Weight Bearing Restrictions: No      Mobility  Bed Mobility Overal bed mobility: Needs Assistance Bed Mobility: Supine to Sit     Supine to sit: Supervision     General bed mobility comments: Supervision for safety, no physical assist required.  Transfers                 General transfer comment: Refused to get OOB.  Ambulation/Gait                 Stairs            Wheelchair Mobility    Modified Rankin (Stroke Patients Only)       Balance Overall balance assessment: Needs assistance Sitting-balance support: No upper extremity supported;Feet supported Sitting balance-Leahy Scale: Fair                                       Pertinent Vitals/Pain Pain Assessment: No/denies pain    Home Living Family/patient expects to be discharged to:: Private residence Living Arrangements: Children (daughter) Available Help at Discharge: Family;Available 24 hours/day Type of Home: Apartment Home Access: Level entry     Home Layout: One level Home Equipment: Cane - single point;Walker - 4 wheels;Bedside commode (4LO2 at home)      Prior Function Level of Independence: Needs assistance   Gait / Transfers Assistance Needed: ambulates short distances with rollator  ADL's / Homemaking Assistance Needed: daughter assists with bathing and dressing        Hand Dominance        Extremity/Trunk Assessment   Upper Extremity Assessment Upper Extremity Assessment: Defer to OT evaluation    Lower Extremity Assessment Lower Extremity Assessment: Generalized weakness    Cervical / Trunk Assessment Cervical / Trunk Assessment: Normal  Communication   Communication: No difficulties  Cognition Arousal/Alertness: Awake/alert Behavior During Therapy: WFL for tasks assessed/performed Overall Cognitive Status: Within Functional Limits for tasks assessed                                        General Comments      Exercises     Assessment/Plan    PT Assessment Patient needs continued PT services  PT Problem List Decreased balance;Decreased mobility;Decreased activity tolerance;Decreased knowledge of use of DME;Decreased safety awareness;Decreased knowledge of precautions       PT Treatment Interventions DME instruction;Gait training;Functional mobility  training;Therapeutic activities;Therapeutic exercise;Balance training;Patient/family education    PT Goals (Current goals can be found in the Care Plan section)  Acute Rehab PT Goals Patient Stated Goal: return home PT Goal Formulation: With patient Time For Goal Achievement: 06/26/16 Potential to Achieve Goals: Good    Frequency Min 3X/week   Barriers to discharge        Co-evaluation               End of Session Equipment Utilized During Treatment: Oxygen Activity Tolerance: Patient limited by fatigue Patient left: in bed;with call bell/phone within reach;with bed alarm set Nurse Communication: Mobility status PT Visit Diagnosis: Muscle weakness (generalized) (M62.81)    Time: 9147-8295 PT Time Calculation (min) (ACUTE ONLY): 13 min   Charges:   PT Evaluation $PT Eval Moderate Complexity: 1 Procedure     PT G Codes:   PT G-Codes **NOT FOR INPATIENT CLASS** Functional Assessment Tool Used: AM-PAC 6 Clicks Basic Mobility Functional Limitation: Mobility: Walking and moving around Mobility: Walking and Moving Around Current Status (A2130): At least 1 percent but less than 20 percent impaired, limited or restricted Mobility: Walking and Moving Around Goal Status [phone]): At least 1 percent but less than 20 percent impaired, limited or restricted    [facility] Acute Rehabilitation [phone] (pager)   Berline Lopes 06/12/2016, 3:10 PM

## 2016-06-12 NOTE — Progress Notes (Signed)
Patient refused CPAP for the night  

## 2016-06-12 NOTE — Progress Notes (Signed)
  Echocardiogram 2D Echocardiogram has been performed.  Delcie Roch 06/12/2016, 5:33 PM

## 2016-06-12 NOTE — Progress Notes (Signed)
Placed pt on Cpap full face mask pt tolerating well first time patient wearing Cpap could only tolerate small pressure 7.0 CMH20 with 4lpm bled in 02

## 2016-06-12 NOTE — Progress Notes (Signed)
Occupational Therapy Treatment Patient Details Name: Sierra Hampton MRN: [id number] DOB: 06-17-1955 Today's Date: 06/12/2016    History of present illness 61 y.o. female with medical history significant of chronic respiratory failure related to asthma and COPD on home oxygen, obesity, hypertension, presents to emergency department with chief complaint of worsening shortness of breath.   OT comments  Educated pt on use of AE and she was able to return demo use; provided AE to pt for home use. Pt able to perform stand pivot transfer to [facility] with supervision; only performed stand pivot due to urgency. D/c plan remains appropriate. Will continue to follow acutely.   Follow Up Recommendations  Home health OT;Supervision/Assistance - 24 hour;Other (comment) (HH aide)    Equipment Recommendations  3 in 1 bedside commode;Tub/shower bench (bariatric equipment)    Recommendations for Other Services PT consult    Precautions / Restrictions Precautions Precautions: Fall Restrictions Weight Bearing Restrictions: No       Mobility Bed Mobility Overal bed mobility: Needs Assistance Bed Mobility: Supine to Sit     Supine to sit: Supervision     General bed mobility comments: Supervision for safety, no physical assist required.  Transfers Overall transfer level: Needs assistance Equipment used: None Transfers: Sit to/from [employer] Sit to Stand: Supervision Stand pivot transfers: Supervision       General transfer comment: Supervision for safety; pt moving quickly due to urgency of BM.    Balance Overall balance assessment: Needs assistance Sitting-balance support: Feet supported;Single extremity supported Sitting balance-Leahy Scale: Fair     Standing balance support: No upper extremity supported;During functional activity Standing balance-Leahy Scale: Fair                             ADL either performed or assessed with clinical judgement    ADL Overall ADL's : Needs assistance/impaired             Lower Body Bathing: Moderate assistance;Sit to/from stand;With adaptive equipment Lower Body Bathing Details (indicate cue type and reason): Educated pt on use of long handled sponge for increased independence with bathing     Lower Body Dressing: Moderate assistance;Sit to/from stand;With adaptive equipment Lower Body Dressing Details (indicate cue type and reason): Educated pt on use of reacher, long handled shoe horn, and sock aide; pt able to return demo use. Toilet Transfer: Administrator, sports Details (indicate cue type and reason): stand pivot only due to urgency of BM         Functional mobility during ADLs: Supervision/safety General ADL Comments: Educated pt on use of AE and provided her with AE for home use.     Vision       Perception     Praxis      Cognition Arousal/Alertness: Awake/alert Behavior During Therapy: WFL for tasks assessed/performed Overall Cognitive Status: Within Functional Limits for tasks assessed                                          Exercises     Shoulder Instructions       General Comments      Pertinent Vitals/ Pain       Pain Assessment: No/denies pain  Home Living  Prior Functioning/Environment              Frequency  Min 2X/week        Progress Toward Goals  OT Goals(current goals can now be found in the care plan section)  Progress towards OT goals: Progressing toward goals  Acute Rehab OT Goals Patient Stated Goal: return home OT Goal Formulation: With patient  Plan Discharge plan remains appropriate    Co-evaluation                 End of Session Equipment Utilized During Treatment: Oxygen;Other (comment) (AE)  OT Visit Diagnosis: Unsteadiness on feet (R26.81);Other abnormalities of gait and mobility (R26.89)   Activity  Tolerance Patient tolerated treatment well   Patient Left with call bell/phone within reach;Other (comment) (on [facility])   Nurse Communication          Time: 4034-7425 OT Time Calculation (min): 25 min  Charges: OT General Charges $OT Visit: 1 Procedure OT Treatments $Self Care/Home Management : 23-37 mins  Timika Muench A. Brett Albino, M.S., OTR/L Pager: [phone]   Gaye Alken 06/12/2016, 10:39 AM

## 2016-06-13 LAB — ECHOCARDIOGRAM COMPLETE
AVLVOTPG: 6 mmHg
CHL CUP DOP CALC LVOT VTI: 25.6 cm
FS: 22 % — AB (ref 28–44)
HEIGHTINCHES: 68 in
IVS/LV PW RATIO, ED: 1
LA ID, A-P, ES: 45 mm
LA diam end sys: 45 mm
LA diam index: 1.34 cm/m2
LA vol A4C: 101 ml
LA vol index: 33.1 mL/m2
LAVOL: 111 mL
LDCA: 4.91 cm2
LV e' LATERAL: 8.35 cm/s
LVOT diameter: 25 mm
LVOTPV: 120 cm/s
LVOTSV: 126 mL
Lateral S' vel: 14.8 cm/s
PW: 13.9 mm — AB (ref 0.6–1.1)
RV TAPSE: 18.9 mm
TDI e' lateral: 8.35
TDI e' medial: 12
WEIGHTICAEL: 7562.66 [oz_av]

## 2016-06-13 LAB — CBC
HEMATOCRIT: 38.2 % (ref 36.0–46.0)
Hemoglobin: 11.4 g/dL — ABNORMAL LOW (ref 12.0–15.0)
MCH: 29.2 pg (ref 26.0–34.0)
MCHC: 29.8 g/dL — AB (ref 30.0–36.0)
MCV: 97.7 fL (ref 78.0–100.0)
Platelets: 252 10*3/uL (ref 150–400)
RBC: 3.91 MIL/uL (ref 3.87–5.11)
RDW: 14.6 % (ref 11.5–15.5)
WBC: 15.3 10*3/uL — ABNORMAL HIGH (ref 4.0–10.5)

## 2016-06-13 LAB — GLUCOSE, CAPILLARY: GLUCOSE-CAPILLARY: 98 mg/dL (ref 65–99)

## 2016-06-13 LAB — BASIC METABOLIC PANEL
ANION GAP: 12 (ref 5–15)
BUN: 25 mg/dL — ABNORMAL HIGH (ref 6–20)
CHLORIDE: 101 mmol/L (ref 101–111)
CO2: 29 mmol/L (ref 22–32)
CREATININE: 1.17 mg/dL — AB (ref 0.44–1.00)
Calcium: 8.7 mg/dL — ABNORMAL LOW (ref 8.9–10.3)
GFR calc non Af Amer: 50 mL/min — ABNORMAL LOW (ref >60)
GFR, EST AFRICAN AMERICAN: 58 mL/min — AB (ref >60)
Glucose, Bld: 104 mg/dL — ABNORMAL HIGH (ref 65–99)
POTASSIUM: 3.7 mmol/L (ref 3.5–5.1)
SODIUM: 142 mmol/L (ref 135–145)

## 2016-06-13 MED ORDER — GI COCKTAIL ~~LOC~~
30.0000 mL | Freq: Once | ORAL | Status: AC
  Administered 2016-06-13: 30 mL via ORAL
  Filled 2016-06-13: qty 30
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
## 2016-06-13 NOTE — Progress Notes (Signed)
PROGRESS NOTE    Sierra Hampton  WGN:[id number] DOB: 02/02/1956 DOA: 06/11/2016 PCP: Lonia Blood, MD   Chief Complaint  Patient presents with  . Shortness of Breath    Brief Narrative:  HPI On 06/11/2016 by Ms. Sierra Smothers, NP Sierra Hampton is a very pleasant 61 y.o. female with medical history significant of chronic respiratory failure related to asthma and COPD on home oxygen, obesity, hypertension, presents to emergency department with chief complaint of worsening shortness of breath. Initial evaluation reveals acute on chronic respiratory failure likely related to COPD exacerbation and possible mild acute heart failure.  Information is obtained from the patient. She states she is on 2 L of oxygen at home at night she uses nebulizers and inhalers. 3 days ago she developed gradual worsening shortness of breath. She used her inhalers and nebulizers with no improvement. She states moving about makes it worse. She denies headache dizziness syncope or near-syncope. She denies chest pain palpitation cough lower extremity edema. She denies abdominal pain nausea vomiting diarrhea constipation melena. She denies dysuria hematuria frequency or urgency. She reports she stopped smoking in 2011. Assessment & Plan   Acute on chronic hypoxic respiratory failure  -likely related to COPD exacerbation in the setting of possible mild acute CHF and morbid obesity -Patient's baseline 2 L oxygen nasal cannula at night as needed.  -Chest x-ray with vascular interstitial prominence may represent mild congestive heart or no focal airspace consolidation no effusion.  -She has increased oxygen demand with ambulation. -Continue Solu-Medrol, nebulizer treatments, Lasix, flutter valve -Echocardiogram with normal EF, diastolic dysfunction. -BNP 237  COPD exacerbation -History of COPD and asthma -uses oxygen at 2 L at night as needed reportedly.  -chest x-ray as noted above -Continue treatment plan as  above -Likely benefit from outpatient pulmonary function test  Acute on chronic diastolic heart failure -No history of heart failure, BNP elevated to 37  -Chest x-ray as noted above  -Continue IV Lasix, monitor intake and output and daily weights  -Lisinopril currently held due to acute kidney injury  -Echocardiogram normal EF, no wall motion abnormalities.  Acute kidney injury versus CKD, stage II -Baseline creatinine 2014 was approximately 0.9, currently 1.05 -Continue to monitor BMP  Elevated troponin -Likely demand, troponin peaked at 0.09 -Denies chest pain  Essential Hypertension -Continue amlodipine -Lisinopril, HCTZ held due to possible AKI  Morbid Obesity -BMI 70 -nutritional consult  Obstructive sleep apnea  -Review indicates had a sleep study in October 2017. Results noted obstructive sleep apnea and nocturnal hypoxemia. Note indicates moderate oxygen desaturation and EKG with PVCs. He recommended sleep apnea. Unclear of patient's compliance. -Respiratory therapy consult for CPap  DVT Prophylaxis  lovenox  Code Status: Full  Family Communication: None at bedside  Disposition Plan: Admitted. Improvement in respiratory status as well as mental status  Consultants None  Procedures  None  Antibiotics   Anti-infectives    None      Subjective:   Sierra Hampton sleepy and lethargic, shortness of breath is still present, complains about abdominal pain and diarrhea present on since admission.  Objective:   Vitals:   06/13/16 0648 06/13/16 1005 06/13/16 1409 06/13/16 1509  BP:  (!) 168/94  (!) 148/70  Pulse:    80  Resp:    20  Temp:    98.3 F (36.8 C)  TempSrc:      SpO2:   95% 98%  Weight: (!) 210 kg (462 lb 15.5 oz)     Height:  Intake/Output Summary (Last 24 hours) at 06/13/16 1534 Last data filed at 06/12/16 1556  Gross per 24 hour  Intake                0 ml  Output                0 ml  Net                0 ml   Filed  Weights   06/11/16 0706 06/13/16 0648  Weight: (!) 214.4 kg (472 lb 10.7 oz) (!) 210 kg (462 lb 15.5 oz)    Exam  General: Well developed, well nourished, NAD, appears stated age, Morbidly obese  HEENT: NCAT, membranes moist.   Neck: Supple, no masses. Unable to assess JVD due to body habitus  Cardiovascular: S1 S2 auscultated, no rubs, murmurs or gallops. Regular rate and rhythm.  Respiratory: Breath sounds diminished however no wheezing, mild increased work of breathing  Abdomen: Soft, Obese, nontender, nondistended, + bowel sounds  Extremities: warm dry without cyanosis clubbing or edema  Neuro: AAOx3, nonfocal  Psych: Normal affect and demeanor with intact judgement and insight   Data Reviewed: I have personally reviewed following labs and imaging studies  CBC:  Recent Labs Lab 06/11/16 0110 06/12/16 0926 06/13/16 0557  WBC 8.5 13.2* 15.3*  HGB 11.9* 12.2 11.4*  HCT 38.7 39.9 38.2  MCV 94.4 95.2 97.7  PLT 231 238 252   Basic Metabolic Panel:  Recent Labs Lab 06/11/16 0110 06/12/16 0926 06/13/16 0557  NA 140 141 142  K 3.8 4.0 3.7  CL 104 105 101  CO2 GLUCOSE 96 119* 104*  BUN 11 19 25*  CREATININE 1.06* 1.05* 1.17*  CALCIUM 8.8* 9.0 8.7*   GFR: Estimated Creatinine Clearance: 101.2 mL/min (A) (by C-G formula based on SCr of 1.17 mg/dL (H)). Liver Function Tests:  Recent Labs Lab 06/11/16 0110  AST 29  ALT 30  ALKPHOS 94  BILITOT 0.5  PROT 7.3  ALBUMIN 3.5   No results for input(s): LIPASE, AMYLASE in the last 168 hours. No results for input(s): AMMONIA in the last 168 hours. Coagulation Profile: No results for input(s): INR, PROTIME in the last 168 hours. Cardiac Enzymes:  Recent Labs Lab 06/11/16 0250 06/11/16 0724 06/11/16 1233 06/11/16 1840  TROPONINI 0.05* 0.03* 0.03* 0.09*   BNP (last 3 results) No results for input(s): PROBNP in the last 8760 hours. HbA1C: No results for input(s): HGBA1C in the last 72  hours. CBG: No results for input(s): GLUCAP in the last 168 hours. Lipid Profile: No results for input(s): CHOL, HDL, LDLCALC, TRIG, CHOLHDL, LDLDIRECT in the last 72 hours. Thyroid Function Tests: No results for input(s): TSH, T4TOTAL, FREET4, T3FREE, THYROIDAB in the last 72 hours. Anemia Panel: No results for input(s): VITAMINB12, FOLATE, FERRITIN, TIBC, IRON, RETICCTPCT in the last 72 hours. Urine analysis:    Component Value Date/Time   COLORURINE COLORLESS (A) 06/11/2016 0810   APPEARANCEUR CLEAR 06/11/2016 0810   LABSPEC 1.004 (L) 06/11/2016 0810   PHURINE 6.0 06/11/2016 0810   GLUCOSEU NEGATIVE 06/11/2016 0810   HGBUR NEGATIVE 06/11/2016 0810   BILIRUBINUR NEGATIVE 06/11/2016 0810   KETONESUR NEGATIVE 06/11/2016 0810   PROTEINUR NEGATIVE 06/11/2016 0810   UROBILINOGEN 2.0 (H) 11/13/2012 1410   NITRITE NEGATIVE 06/11/2016 0810   LEUKOCYTESUR NEGATIVE 06/11/2016 0810   Sepsis Labs: (procalcitonin:4,lacticidven:4)  )No results found for this or any previous visit (from the past 240 hour(s)).  Radiology Studies: No results found.   Scheduled Meds: . albuterol  2.5 mg Nebulization TID  . amLODipine  10 mg Oral Daily  . enoxaparin (LOVENOX) injection  100 mg Subcutaneous Daily  . escitalopram  20 mg Oral Daily  . estrogens (conjugated)  0.625 mg Oral Daily   And  . medroxyPROGESTERone  2.5 mg Oral Daily  . pantoprazole  40 mg Oral Daily  . sodium chloride flush  3 mL Intravenous Q12H  . sodium chloride flush  3 mL Intravenous Q12H   Continuous Infusions:   LOS: 2 days   Time Spent in minutes   30 minutes  Author:  Lynden Oxford, MD [facility] Pager: [phone] 06/13/2016 3:37 PM     After 7pm go to [email or web address] - password [email or web address]  And look for the night coverage person covering for me after hours  [facility] Office  [phone]

## 2016-06-14 ENCOUNTER — Inpatient Hospital Stay (HOSPITAL_COMMUNITY): Payer: Medicaid Other

## 2016-06-14 DIAGNOSIS — J9601 Acute respiratory failure with hypoxia: Secondary | ICD-10-CM

## 2016-06-14 LAB — BLOOD GAS, ARTERIAL
ACID-BASE EXCESS: 4.8 mmol/L — AB (ref 0.0–2.0)
Acid-Base Excess: 6.8 mmol/L — ABNORMAL HIGH (ref 0.0–2.0)
Acid-Base Excess: 7.3 mmol/L — ABNORMAL HIGH (ref 0.0–2.0)
BICARBONATE: 34 mmol/L — AB (ref 20.0–28.0)
BICARBONATE: 33.7 mmol/L — AB (ref 20.0–28.0)
Bicarbonate: 31.1 mmol/L — ABNORMAL HIGH (ref 20.0–28.0)
Drawn by: 213381
Drawn by: 301361
Drawn by: 270221
EXPIRATORY PAP: 6
EXPIRATORY PAP: 6
FIO2: 21
FIO2: 40
FIO2: 30
INSPIRATORY PAP: 18
INSPIRATORY PAP: 18
LHR: 18 {breaths}/min
Mode: POSITIVE
Mode: POSITIVE
O2 SAT: 95.9 %
O2 SAT: 89.9 %
O2 Saturation: 67.8 %
PATIENT TEMPERATURE: 98.6
PATIENT TEMPERATURE: 98.6
PEEP/CPAP: 6 cmH2O
PH ART: 7.277 — AB (ref 7.350–7.450)
PH ART: 7.237 — AB (ref 7.350–7.450)
PO2 ART: 38.3 mmHg — AB (ref 83.0–108.0)
PO2 ART: 62.4 mmHg — AB (ref 83.0–108.0)
Patient temperature: 98.6
Pressure control: 12 cmH2O
RATE: 12 resp/min
pCO2 arterial: 68.9 mmHg (ref 32.0–48.0)
pCO2 arterial: 82.9 mmHg (ref 32.0–48.0)
pCO2 arterial: 72.2 mmHg (ref 32.0–48.0)
pH, Arterial: 7.291 — ABNORMAL LOW (ref 7.350–7.450)
pO2, Arterial: 88.7 mmHg (ref 83.0–108.0)

## 2016-06-14 LAB — GLUCOSE, CAPILLARY
GLUCOSE-CAPILLARY: 89 mg/dL (ref 65–99)
GLUCOSE-CAPILLARY: 97 mg/dL (ref 65–99)
Glucose-Capillary: 87 mg/dL (ref 65–99)
Glucose-Capillary: 121 mg/dL — ABNORMAL HIGH (ref 65–99)
Glucose-Capillary: 89 mg/dL (ref 65–99)
Glucose-Capillary: 90 mg/dL (ref 65–99)

## 2016-06-14 LAB — CBC
HCT: 38.1 % (ref 36.0–46.0)
Hemoglobin: 11.4 g/dL — ABNORMAL LOW (ref 12.0–15.0)
MCH: 29.1 pg (ref 26.0–34.0)
MCHC: 29.9 g/dL — ABNORMAL LOW (ref 30.0–36.0)
MCV: 97.2 fL (ref 78.0–100.0)
PLATELETS: 203 10*3/uL (ref 150–400)
RBC: 3.92 MIL/uL (ref 3.87–5.11)
RDW: 14.2 % (ref 11.5–15.5)
WBC: 12.6 10*3/uL — ABNORMAL HIGH (ref 4.0–10.5)

## 2016-06-14 LAB — MRSA PCR SCREENING: MRSA by PCR: NEGATIVE

## 2016-06-14 LAB — PROTIME-INR
INR: 1.03
PROTHROMBIN TIME: 13.6 s (ref 11.4–15.2)

## 2016-06-14 LAB — COMPREHENSIVE METABOLIC PANEL
ALT: 68 U/L — AB (ref 14–54)
ANION GAP: 10 (ref 5–15)
AST: 44 U/L — ABNORMAL HIGH (ref 15–41)
Albumin: 3 g/dL — ABNORMAL LOW (ref 3.5–5.0)
Alkaline Phosphatase: 86 U/L (ref 38–126)
BUN: 25 mg/dL — AB (ref 6–20)
CHLORIDE: 104 mmol/L (ref 101–111)
CO2: 28 mmol/L (ref 22–32)
CREATININE: 1.03 mg/dL — AB (ref 0.44–1.00)
Calcium: 8.5 mg/dL — ABNORMAL LOW (ref 8.9–10.3)
GFR, EST NON AFRICAN AMERICAN: 59 mL/min — AB (ref >60)
GLUCOSE: 101 mg/dL — AB (ref 65–99)
POTASSIUM: 3.9 mmol/L (ref 3.5–5.1)
Sodium: 142 mmol/L (ref 135–145)
Total Bilirubin: 0.4 mg/dL (ref 0.3–1.2)
Total Protein: 7.3 g/dL (ref 6.5–8.1)

## 2016-06-14 LAB — AMMONIA: AMMONIA: 57 umol/L — AB (ref 9–35)

## 2016-06-14 LAB — LACTIC ACID, PLASMA
Lactic Acid, Venous: 0.8 mmol/L (ref 0.5–1.9)
Lactic Acid, Venous: 0.6 mmol/L (ref 0.5–1.9)

## 2016-06-14 LAB — PROCALCITONIN: Procalcitonin: 0.17 ng/mL

## 2016-06-14 LAB — VITAMIN B12: Vitamin B-12: 564 pg/mL (ref 180–914)

## 2016-06-14 LAB — T4, FREE: Free T4: 0.7 ng/dL (ref 0.61–1.12)

## 2016-06-14 LAB — TSH: TSH: 11.463 u[IU]/mL — ABNORMAL HIGH (ref 0.350–4.500)

## 2016-06-14 MED ORDER — CHLORHEXIDINE GLUCONATE 0.12 % MT SOLN
15.0000 mL | Freq: Two times a day (BID) | OROMUCOSAL | Status: DC
  Administered 2016-06-15 – 2016-06-18 (×9): 15 mL via OROMUCOSAL
  Filled 2016-06-14 (×8): qty 15

## 2016-06-14 MED ORDER — BUDESONIDE 0.5 MG/2ML IN SUSP
0.5000 mg | Freq: Two times a day (BID) | RESPIRATORY_TRACT | Status: DC
  Administered 2016-06-14 – 2016-06-19 (×9): 0.5 mg via RESPIRATORY_TRACT
  Filled 2016-06-14 (×13): qty 2

## 2016-06-14 MED ORDER — FUROSEMIDE 10 MG/ML IJ SOLN
80.0000 mg | Freq: Three times a day (TID) | INTRAMUSCULAR | Status: DC
  Administered 2016-06-14 – 2016-06-17 (×10): 80 mg via INTRAVENOUS
  Filled 2016-06-14 (×12): qty 8

## 2016-06-14 MED ORDER — ORAL CARE MOUTH RINSE
15.0000 mL | Freq: Two times a day (BID) | OROMUCOSAL | Status: DC
  Administered 2016-06-15 – 2016-06-16 (×2): 15 mL via OROMUCOSAL

## 2016-06-14 MED ORDER — FUROSEMIDE 10 MG/ML IJ SOLN
60.0000 mg | Freq: Two times a day (BID) | INTRAMUSCULAR | Status: DC
  Administered 2016-06-14: 60 mg via INTRAVENOUS
  Filled 2016-06-14: qty 6

## 2016-06-14 MED ORDER — IPRATROPIUM-ALBUTEROL 0.5-2.5 (3) MG/3ML IN SOLN
3.0000 mL | Freq: Four times a day (QID) | RESPIRATORY_TRACT | Status: DC
  Administered 2016-06-14 – 2016-06-19 (×19): 3 mL via RESPIRATORY_TRACT
  Filled 2016-06-14 (×22): qty 3

## 2016-06-14 MED ORDER — CHLORHEXIDINE GLUCONATE 0.12 % MT SOLN: 15.0000 mL | Freq: Two times a day (BID) | OROMUCOSAL | Status: DC

## 2016-06-14 MED ORDER — PREDNISONE 50 MG PO TABS
50.0000 mg | ORAL_TABLET | Freq: Every day | ORAL | Status: DC
  Administered 2016-06-15 – 2016-06-16 (×2): 50 mg via ORAL
  Filled 2016-06-14 (×3): qty 1

## 2016-06-14 MED ORDER — INSULIN ASPART 100 UNIT/ML ~~LOC~~ SOLN
0.0000 [IU] | SUBCUTANEOUS | Status: DC
  Administered 2016-06-15 – 2016-06-16 (×3): 3 [IU] via SUBCUTANEOUS
  Administered 2016-06-16 – 2016-06-18 (×4): 4 [IU] via SUBCUTANEOUS
  Administered 2016-06-18 – 2016-06-19 (×2): 3 [IU] via SUBCUTANEOUS

## 2016-06-14 NOTE — Progress Notes (Signed)
PROGRESS NOTE    Sierra Hampton  JXB:[id number] DOB: 1955-12-09 DOA: 06/11/2016 PCP: Lonia Blood, MD   Chief Complaint  Patient presents with  . Shortness of Breath    Brief Narrative:  HPI On 06/11/2016 by Ms. [name], NP Sierra Hampton is a very pleasant 61 y.o. female with medical history significant of chronic respiratory failure related to asthma and COPD on home oxygen, obesity, hypertension, presents to emergency department with chief complaint of worsening shortness of breath. Initial evaluation reveals acute on chronic respiratory failure likely related to COPD exacerbation and possible mild acute heart failure.  Information is obtained from the patient. She states she is on 2 L of oxygen at home at night she uses nebulizers and inhalers. 3 days ago she developed gradual worsening shortness of breath. She used her inhalers and nebulizers with no improvement. She states moving about makes it worse. She denies headache dizziness syncope or near-syncope. She denies chest pain palpitation cough lower extremity edema. She denies abdominal pain nausea vomiting diarrhea constipation melena. She denies dysuria hematuria frequency or urgency. She reports she stopped smoking in 2011. Assessment & Plan   Acute on chronic hypoxic respiratory failure Acute Hypercarbic respiratory failure requiring BiPAP Presentation is multifactorial, primarily etiology appears acute on chronic diastolic dysfunction with underlying COPD and OSA. - Patient's baseline 2 L oxygen nasal cannula at night as needed.  - Chest x-ray with vascular interstitial prominence may represent mild congestive heart or no focal airspace consolidation no effusion.  -She has increased oxygen demand with ambulation. -Continue Solu-Medrol, nebulizer treatments, Lasix, flutter valve -Echocardiogram with normal EF, diastolic dysfunction. On 06/14/2016 ABG shows severe hypercarbia. CCM consulted. Continue BiPAP at present. May  require intubation. Continue duo nebs, steroids. Continue IV diuresis. Worsening hypercarbia is likely secondary to pt's chronic opioids, stopped them.  COPD exacerbation -History of COPD and asthma -uses oxygen at 2 L at night as needed reportedly.  -chest x-ray as noted above -Continue treatment plan as above -Likely benefit from outpatient pulmonary function test  Acute on chronic diastolic heart failure -No history of heart failure, BNP elevated to 37  -Chest x-ray as noted above  -Continue IV Lasix, monitor intake and output and daily weights , will get a Foley catheter. -Lisinopril currently held due to acute kidney injury  -Echocardiogram normal EF, no wall motion abnormalities.  Acute kidney injury versus CKD, stage II -Baseline creatinine 2014 was approximately 0.9, currently 1.05 -Continue to monitor BMP  Elevated troponin -Likely demand, troponin peaked at 0.09 -Denies chest pain  Essential Hypertension -Continue amlodipine -Lisinopril, HCTZ held due to possible AKI  Morbid Obesity -BMI 70 -nutritional consult  Obstructive sleep apnea  -Review indicates had a sleep study in October 2017. Results noted obstructive sleep apnea and nocturnal hypoxemia. Note indicates moderate oxygen desaturation and EKG with PVCs. He recommended CPAP. Patient noncompliant at home.  DVT Prophylaxis  lovenox  Code Status: Full  Family Communication: None at bedside  Disposition Plan: Admitted. Improvement in respiratory status as well as mental status  Consultants Critical care medicine  Procedures  BiPAP  Antibiotics   Anti-infectives    None      Subjective:   Sierra Hampton more sleepy, improved with BiPAP. Abdominal pain resolved. Diarrhea resolved.  Objective:   Vitals:   06/14/16 1530 06/14/16 1603 06/14/16 1612 06/14/16 1618  BP: 131/74 133/73    Pulse: 67 73    Resp:  18    Temp:  98.8 F (37.1 C)  TempSrc:      SpO2: 91% 91% 94% 92%  Weight:       Height:        Intake/Output Summary (Last 24 hours) at 06/14/16 1624 Last data filed at 06/14/16 1439  Gross per 24 hour  Intake                0 ml  Output              750 ml  Net             -750 ml   Filed Weights   06/11/16 0706 06/13/16 0648 06/14/16 0746  Weight: (!) 214.4 kg (472 lb 10.7 oz) (!) 210 kg (462 lb 15.5 oz) (!) 209.6 kg (462 lb)    Exam  General: Well developed, well nourished, NAD, appears stated age, Morbidly obese  HEENT: NCAT, membranes moist.   Neck: Supple, no masses. Unable to assess JVD due to body habitus  Cardiovascular: S1 S2 auscultated, no rubs, murmurs or gallops. Regular rate and rhythm.  Respiratory: Breath sounds diminished however no wheezing, mild increased work of breathing  Abdomen: Soft, Obese, nontender, nondistended, + bowel sounds  Extremities: warm dry without cyanosis clubbing or edema  Neuro: AAOx3, nonfocal  Psych: Normal affect and demeanor with intact judgement and insight   Data Reviewed: I have personally reviewed following labs and imaging studies  CBC:  Recent Labs Lab 06/11/16 0110 06/12/16 0926 06/13/16 0557 06/14/16 0611  WBC 8.5 13.2* 15.3* 12.6*  HGB 11.9* 12.2 11.4* 11.4*  HCT 38.7 39.9 38.2 38.1  MCV 94.4 95.2 97.7 97.2  PLT 231 238 252 203   Basic Metabolic Panel:  Recent Labs Lab 06/11/16 0110 06/12/16 0926 06/13/16 0557 06/14/16 0611  NA 140 141 142 142  K 3.8 4.0 3.7 3.9  CL 104 105 101 104  CO2 GLUCOSE 96 119* 104* 101*  BUN 11 19 25* 25*  CREATININE 1.06* 1.05* 1.17* 1.03*  CALCIUM 8.8* 9.0 8.7* 8.5*   GFR: Estimated Creatinine Clearance: 114.9 mL/min (A) (by C-G formula based on SCr of 1.03 mg/dL (H)). Liver Function Tests:  Recent Labs Lab 06/11/16 0110 06/14/16 0611  AST 29 44*  ALT 30 68*  ALKPHOS 94 86  BILITOT 0.5 0.4  PROT 7.3 7.3  ALBUMIN 3.5 3.0*   No results for input(s): LIPASE, AMYLASE in the last 168 hours.  Recent Labs Lab  06/14/16 0814  AMMONIA 57*   Coagulation Profile:  Recent Labs Lab 06/14/16 0611  INR 1.03   Cardiac Enzymes:  Recent Labs Lab 06/11/16 0250 06/11/16 0724 06/11/16 1233 06/11/16 1840  TROPONINI 0.05* 0.03* 0.03* 0.09*   BNP (last 3 results) No results for input(s): PROBNP in the last 8760 hours. HbA1C: No results for input(s): HGBA1C in the last 72 hours. CBG:  Recent Labs Lab 06/13/16 2203 06/14/16 0458 06/14/16 0822 06/14/16 1449  GLUCAP 98 87 121* 89   Lipid Profile: No results for input(s): CHOL, HDL, LDLCALC, TRIG, CHOLHDL, LDLDIRECT in the last 72 hours. Thyroid Function Tests:  Recent Labs  06/14/16 0814 06/14/16 1102  TSH 11.463*  --   FREET4  --  0.70   Anemia Panel:  Recent Labs  06/14/16 0814  VITAMINB12 564   Urine analysis:    Component Value Date/Time   COLORURINE COLORLESS (A) 06/11/2016 0810   APPEARANCEUR CLEAR 06/11/2016 0810   LABSPEC 1.004 (L) 06/11/2016 0810   PHURINE 6.0 06/11/2016 0810   GLUCOSEU  NEGATIVE 06/11/2016 0810   HGBUR NEGATIVE 06/11/2016 0810   BILIRUBINUR NEGATIVE 06/11/2016 0810   KETONESUR NEGATIVE 06/11/2016 0810   PROTEINUR NEGATIVE 06/11/2016 0810   UROBILINOGEN 2.0 (H) 11/13/2012 1410   NITRITE NEGATIVE 06/11/2016 0810   LEUKOCYTESUR NEGATIVE 06/11/2016 0810   Sepsis Labs: (procalcitonin:4,lacticidven:4)  ) Recent Results (from the past 240 hour(s))  MRSA PCR Screening     Status: None   Collection Time: 06/14/16  1:33 PM  Result Value Ref Range Status   MRSA by PCR NEGATIVE NEGATIVE Final    Comment:        The GeneXpert MRSA Assay (FDA approved for NASAL specimens only), is one component of a comprehensive MRSA colonization surveillance program. It is not intended to diagnose MRSA infection nor to guide or monitor treatment for MRSA infections.       Radiology Studies: Ct Head Wo Contrast  Result Date: 06/14/2016 CLINICAL DATA:  Patient is sleepy and lethargic. Shortness  of breath. EXAM: CT HEAD WITHOUT CONTRAST TECHNIQUE: Contiguous axial images were obtained from the base of the skull through the vertex without intravenous contrast. COMPARISON:  February 02, 2016 FINDINGS: The study is limited through the cerebellum and lower brain due to beam hardening and streak artifact. I spoke to the technologist who indicated these are the best possible images given patient's morbid obesity and condition. Brain: No subdural, epidural, or subarachnoid hemorrhage. The basal cisterns are patent. Cerebellum is not well evaluated but is grossly unremarkable. The brainstem is also poorly evaluated without obvious abnormality. No midline shift. No acute cortical ischemia or infarct. Vascular: No hyperdense vessel or unexpected calcification. Skull: Normal. Negative for fracture or focal lesion. Sinuses/Orbits: Mucosal thickening is seen in the ethmoid and sphenoid sinuses with no air-fluid levels. The mastoid air cells and middle ears are well aerated. Other: None. IMPRESSION: 1. Significantly limited study as above with no evidence of hemorrhage, ischemia, or infarct on provided images. Electronically Signed   By: Gerome Sam III M.D   On: 06/14/2016 08:02     Scheduled Meds: . albuterol  2.5 mg Nebulization TID  . amLODipine  10 mg Oral Daily  . budesonide (PULMICORT) nebulizer solution  0.5 mg Nebulization BID  . enoxaparin (LOVENOX) injection  100 mg Subcutaneous Daily  . escitalopram  20 mg Oral Daily  . estrogens (conjugated)  0.625 mg Oral Daily   And  . medroxyPROGESTERone  2.5 mg Oral Daily  . furosemide  80 mg Intravenous Q8H  . insulin aspart  0-20 Units Subcutaneous Q4H  . ipratropium-albuterol  3 mL Nebulization QID  . pantoprazole  40 mg Oral Daily  . predniSONE  50 mg Oral Q breakfast  . sodium chloride flush  3 mL Intravenous Q12H  . sodium chloride flush  3 mL Intravenous Q12H   Continuous Infusions:   LOS: 3 days   Time Spent in minutes   35  minutes  Author:  Lynden Oxford, MD [facility] Pager: [phone] 06/14/2016 4:24 PM     After 7pm go to [email or web address] - password [email or web address]  And look for the night coverage person covering for me after hours  [facility] Office  [phone]

## 2016-06-14 NOTE — Significant Event (Signed)
Rapid Response Event Note  Overview:  Informed by RT of ABG Time Called: 0839 Arrival Time: 0845 Event Type: Respiratory  Initial Focused Assessment:  Informed by RT of ABG drawn with results 7.2, 68 co2, 38 o2 and patient lethargic, was found off her oxygen.  On my arrival pateint lying in be with HF nasal cannula on at 8 lpm sats 93%, lethargic. 146/63, RR 12, HR 75  Interventions:  MD paged with ABG results, will attempt to try BIPAP.  Spoke with Dr. Allena Katz.  BIPAP started at 0900, so far tolerating BIPAP, will continue to monitor.  Dr. Allena Katz at bedside, ABG in 1 hour after BIPAP on Repeat ABG in 1 hour ABG worse, however, periods of more alertness but still lethargic  Plan of Care (if not transferred):  Patient  to transfer to SDU when bed available.  RR RN and RT  sitting with patient until transfer  Event Summary:   Patient transferred to 12M   at      at          [facility], [place]

## 2016-06-14 NOTE — Consult Note (Signed)
PULMONARY / CRITICAL CARE MEDICINE   Name: MAEGHAN Hampton MRN: [id number] DOB: 1955/09/28    ADMISSION DATE:  06/11/2016 CONSULTATION DATE:  06/14/2016   REFERRING MD:  Dr. Allena Katz   CHIEF COMPLAINT:  Respiratory Failure   HISTORY OF PRESENT ILLNESS:   Sierra Hampton is a very pleasant 61 y.o. female with medical history significant of chronic respiratory failure related to asthma and COPD on home oxygen, obesity, hypertension, presents to emergency department with chief complaint of worsening shortness of breath for 3 days. Initial evaluation reveals acute on chronic respiratory failure likely related to COPD exacerbation and possible mild acute heart failure.  Patient has sleep apnea for which she is not compliant to CPAP therapy.  She was admitted to the floors and was treated as CHF diastolic heart failure exacerbation/pulmonary edema with diuresis. Patient also has chronic pain issues for which she was getting pain medicines when necessary. She was noted to be sleepy yesterday which worsened today and she was noted to be lethargic this morning.  ABG this morning showed7.28/69/38 on RA. He was placed on BiPAP 18/6 40% and rpt ABG with 7.24/83/89. She is starting to wake up however. Goes into episodes of sleepiness.   PAST MEDICAL HISTORY :  She  has a past medical history of Asthma; COPD (chronic obstructive pulmonary disease) (HCC); Dyspnea; Hypertension; Nocturnal hypoxemia; Obstructive sleep apnea; and Ventral hernia.  PAST SURGICAL HISTORY: She  has a past surgical history that includes Abdominal hysterectomy and Uterine fibroid surgery.  Allergies  Allergen Reactions  . Penicillins Hives    No current facility-administered medications on file prior to encounter.    Current Outpatient Prescriptions on File Prior to Encounter  Medication Sig  . albuterol (PROVENTIL HFA;VENTOLIN HFA) 108 (90 BASE) MCG/ACT inhaler Inhale 2 puffs into the lungs every 6 (six) hours as needed  for wheezing.  Marland Kitchen albuterol (PROVENTIL) (2.5 MG/3ML) 0.083% nebulizer solution Take 2.5 mg by nebulization every 6 (six) hours as needed for wheezing or shortness of breath.  Marland Kitchen amLODipine (NORVASC) 10 MG tablet Take 10 mg by mouth daily.  Marland Kitchen escitalopram (LEXAPRO) 20 MG tablet Take 20 mg by mouth daily.  Marland Kitchen estrogen, conjugated,-medroxyprogesterone (PREMPRO) 0.625-2.5 MG per tablet Take 1 tablet by mouth daily.  . hydrochlorothiazide (HYDRODIURIL) 25 MG tablet Take 25 mg by mouth daily.  Marland Kitchen lisinopril (PRINIVIL,ZESTRIL) 40 MG tablet Take 40 mg by mouth daily.  Marland Kitchen omeprazole (PRILOSEC) 40 MG capsule Take 40 mg by mouth 2 (two) times daily.   Marland Kitchen oxyCODONE-acetaminophen (PERCOCET) 10-325 MG per tablet Take 1 tablet by mouth every 4 (four) hours as needed for pain.    FAMILY HISTORY:  Her indicated that the status of her mother is unknown. She indicated that the status of her father is unknown.    SOCIAL HISTORY: She  reports that she has quit smoking. She has never used smokeless tobacco. She reports that she does not drink alcohol or use drugs.  REVIEW OF SYSTEMS:   Limited 2/2 sleepiness.   SUBJECTIVE:  As above.   VITAL SIGNS: BP (!) 146/63   Pulse 78   Temp 98.3 F (36.8 C) (Axillary)   Resp 18   Ht  (1.727 m)   Wt (!) 209.6 kg (462 lb)   SpO2 98%   BMI 70.25 kg/m   HEMODYNAMICS:    VENTILATOR SETTINGS:    INTAKE / OUTPUT: I/O last 3 completed shifts: In: -  Out: 400 [Urine:400]  PHYSICAL EXAMINATION: General:  drowsy,  arousable, follows simple commands. Neuro:  CN grossly intact. (-) lateralizing signs.  HEENT:  Hard to decipher neck veins.  Crowded airway.  Cardiovascular:  Good S1-S2, no S3 murmur rub or gallop. Lungs:  Decreased sounds in both lung fields. Crackles at the bases. Abdomen:  Obese. (+) pannus.  (+) BS, soft, NT. (-) masses.  Musculoskeletal:  Gr 2 edema.   Skin:  Warm, dry. (-) rash  LABS:  BMET  Recent Labs Lab 06/12/16 0926  06/13/16 0557 06/14/16 0611  NA 141 142 142  K 4.0 3.7 3.9  CL 105 101 104  CO2 BUN 19 25* 25*  CREATININE 1.05* 1.17* 1.03*  GLUCOSE 119* 104* 101*    Electrolytes  Recent Labs Lab 06/12/16 0926 06/13/16 0557 06/14/16 0611  CALCIUM 9.0 8.7* 8.5*    CBC  Recent Labs Lab 06/12/16 0926 06/13/16 0557 06/14/16 0611  WBC 13.2* 15.3* 12.6*  HGB 12.2 11.4* 11.4*  HCT 39.9 38.2 38.1  PLT 238 252 203    Coag's  Recent Labs Lab 06/14/16 0611  INR 1.03    Sepsis Markers  Recent Labs Lab 06/14/16 0814 06/14/16 1105  LATICACIDVEN 0.8 0.6    ABG  Recent Labs Lab 06/14/16 0840 06/14/16 1110  PHART 7.277* 7.237*  PCO2ART 68.9* 82.9*  PO2ART 38.3* 88.7    Liver Enzymes  Recent Labs Lab 06/11/16 0110 06/14/16 0611  AST 29 44*  ALT 30 68*  ALKPHOS 94 86  BILITOT 0.5 0.4  ALBUMIN 3.5 3.0*    Cardiac Enzymes  Recent Labs Lab 06/11/16 0724 06/11/16 1233 06/11/16 1840  TROPONINI 0.03* 0.03* 0.09*    Glucose  Recent Labs Lab 06/13/16 2203 06/14/16 0458 06/14/16 0822  GLUCAP 98 87 121*    Imaging Ct Head Wo Contrast  Result Date: 06/14/2016 CLINICAL DATA:  Patient is sleepy and lethargic. Shortness of breath. EXAM: CT HEAD WITHOUT CONTRAST TECHNIQUE: Contiguous axial images were obtained from the base of the skull through the vertex without intravenous contrast. COMPARISON:  February 02, 2016 FINDINGS: The study is limited through the cerebellum and lower brain due to beam hardening and streak artifact. I spoke to the technologist who indicated these are the best possible images given patient's morbid obesity and condition. Brain: No subdural, epidural, or subarachnoid hemorrhage. The basal cisterns are patent. Cerebellum is not well evaluated but is grossly unremarkable. The brainstem is also poorly evaluated without obvious abnormality. No midline shift. No acute cortical ischemia or infarct. Vascular: No hyperdense vessel or  unexpected calcification. Skull: Normal. Negative for fracture or focal lesion. Sinuses/Orbits: Mucosal thickening is seen in the ethmoid and sphenoid sinuses with no air-fluid levels. The mastoid air cells and middle ears are well aerated. Other: None. IMPRESSION: 1. Significantly limited study as above with no evidence of hemorrhage, ischemia, or infarct on provided images. Electronically Signed   By: Gerome Sam III M.D   On: 06/14/2016 08:02     STUDIES:  2Decho 4/6  Hypokinesis, EF 55%  CULTURES: MRSA 4/8 >>  ANTIBIOTICS: none  SIGNIFICANT EVENTS: 4/5 admitted for acute on chronic dyspnea, treated with diuresis 4/8 transfrred to ICU/SDU for resp depression/hypercapnea. Tried bipap  LINES/TUBES:   DISCUSSION: 35F, with OSA, not on cpap, with asthma/COPD, on home o2, admitted with worsening SOB.  Treated as pulm edema.  Hypercapnea worsened by paine meds so she was tried on bipap and transferred to ICU/SDU. Slowly waking up.     ASSESSMENT / PLAN:  PULMONARY A:  Acute on chronic hypoxemic hypercapnic respiratory failure secondary to unable to protect airway + Untreated OSA + OHS + Pulm edema. No evidence for PNA at this point.  P:   She is starting to wake up. We held off on pain medicines. She takes Percocet for chronic pain and received that yesterday with note of lethargy, worsening throughout the day. Her ABGs worse since being on the BiPAP but clinically she is more awake. We tweaked her noninvasive ventilator. Currently, she is on 18/6, rate of 18, FiO2 30%. Her tidal volumes around 400-600 ML's. She has minimal leak.  Observe her for several hours on the BiPAP. If she worsens clinically, she will need to be intubated. Keep nothing by mouth. Neb meds.  Cont with diuresis Taper off prednisone.  CARDIOVASCULAR A:  Diastolic heart failure. Pulm edema. Hypokinesis on echo. HTN P:  Continue diuresis as ordered. Continue Norvasc.   RENAL A:   Pulmonary  edema. P:   Cont  diuresis. Check lites.  GASTROINTESTINAL A:   No issues P:   NPO. May end up being intubated.  HEMATOLOGIC A:   No issues P:  observe  INFECTIOUS A:   No evidence for infection. P:   Holding off on antibiotics. Check PCT  ENDOCRINE A:   No issues P:   Check cbg  q4 and SSI  NEUROLOGIC A:   Depression Chronic pain P:   RASS goal: 0 Continue on Lexapro. Holding off on opiates for now.  Best practice :  Lovenox for DVT prophylaxis. On PPI.   FAMILY  - Updates: No family at bedside.  - Inter-disciplinary family meet or Palliative Care meeting due by:  06/21/16   I discussed the case with Dr. Allena Katz, [facility]  hospitalist. Plan is to keep patient is stepdown unit. If she worsens, she will be intubated and then will be on ICU service  J. Alexis Frock, MD Pulmonary and Critical Care Medicine [facility] Pager: [phone] After 3 pm or if no response, call [phone]  06/14/2016, 12:49 PM

## 2016-06-14 NOTE — Progress Notes (Signed)
CRITICAL VALUE STICKER  CRITICAL VALUE: pH 7.28 CO2 68.9 PO2 38.9 o@ sat 68% BiCarb 31.1  RECEIVER (on-site recipient of call): Yomira Flitton, RN  DATE & TIME NOTIFIED: 47  MESSENGER (representative from lab):  MD NOTIFIED: Dr. Allena Katz  TIME OF NOTIFICATION: 0900  RESPONSE: Order to transfer patient

## 2016-06-14 NOTE — Progress Notes (Signed)
Paged MD twice for panic ABG results. MD came to MICU. The following ABG values were given to Dr. Lucy Chris PH 7.29 Pa02 62.4 Pc02 72.2 HC03 33.7 Increased IPAP per MD. Will continue to monitor pt progress in ICU.

## 2016-06-15 DIAGNOSIS — J9601 Acute respiratory failure with hypoxia: Secondary | ICD-10-CM

## 2016-06-15 DIAGNOSIS — I509 Heart failure, unspecified: Secondary | ICD-10-CM

## 2016-06-15 DIAGNOSIS — J9602 Acute respiratory failure with hypercapnia: Secondary | ICD-10-CM

## 2016-06-15 LAB — BLOOD GAS, ARTERIAL
ACID-BASE EXCESS: 14.9 mmol/L — AB (ref 0.0–2.0)
BICARBONATE: 40.2 mmol/L — AB (ref 20.0–28.0)
DELIVERY SYSTEMS: POSITIVE
Drawn by: 245131
EXPIRATORY PAP: 6
FIO2: 30
INSPIRATORY PAP: 18
Mode: POSITIVE
O2 SAT: 94.2 %
PH ART: 7.433 (ref 7.350–7.450)
PO2 ART: 68.8 mmHg — AB (ref 83.0–108.0)
Patient temperature: 98.6
RATE: 18 resp/min
pCO2 arterial: 61.1 mmHg — ABNORMAL HIGH (ref 32.0–48.0)

## 2016-06-15 LAB — CBC
HCT: 36.3 % (ref 36.0–46.0)
HEMOGLOBIN: 11 g/dL — AB (ref 12.0–15.0)
MCH: 29.1 pg (ref 26.0–34.0)
MCHC: 30.3 g/dL (ref 30.0–36.0)
MCV: 96 fL (ref 78.0–100.0)
Platelets: 189 10*3/uL (ref 150–400)
RBC: 3.78 MIL/uL — AB (ref 3.87–5.11)
RDW: 14.2 % (ref 11.5–15.5)
WBC: 8.7 10*3/uL (ref 4.0–10.5)

## 2016-06-15 LAB — GLUCOSE, CAPILLARY
GLUCOSE-CAPILLARY: 91 mg/dL (ref 65–99)
GLUCOSE-CAPILLARY: 104 mg/dL — AB (ref 65–99)
GLUCOSE-CAPILLARY: 127 mg/dL — AB (ref 65–99)
Glucose-Capillary: 95 mg/dL (ref 65–99)
Glucose-Capillary: 111 mg/dL — ABNORMAL HIGH (ref 65–99)
Glucose-Capillary: 139 mg/dL — ABNORMAL HIGH (ref 65–99)

## 2016-06-15 LAB — BASIC METABOLIC PANEL
ANION GAP: 11 (ref 5–15)
BUN: 16 mg/dL (ref 6–20)
CHLORIDE: 95 mmol/L — AB (ref 101–111)
CO2: 39 mmol/L — ABNORMAL HIGH (ref 22–32)
Calcium: 8.4 mg/dL — ABNORMAL LOW (ref 8.9–10.3)
Creatinine, Ser: 0.98 mg/dL (ref 0.44–1.00)
GFR calc non Af Amer: >60 mL/min (ref >60)
Glucose, Bld: 99 mg/dL (ref 65–99)
POTASSIUM: 3 mmol/L — AB (ref 3.5–5.1)
SODIUM: 145 mmol/L (ref 135–145)

## 2016-06-15 LAB — MAGNESIUM: MAGNESIUM: 1.9 mg/dL (ref 1.7–2.4)

## 2016-06-15 LAB — PROCALCITONIN: PROCALCITONIN: 0.15 ng/mL

## 2016-06-15 MED ORDER — HYDRALAZINE HCL 20 MG/ML IJ SOLN
10.0000 mg | Freq: Four times a day (QID) | INTRAMUSCULAR | Status: DC | PRN
  Administered 2016-06-16: 10 mg via INTRAVENOUS
  Filled 2016-06-15: qty 1

## 2016-06-15 MED ORDER — SODIUM CHLORIDE 0.9% FLUSH: 10.0000 mL | INTRAVENOUS | Status: DC | PRN

## 2016-06-15 MED ORDER — POTASSIUM CHLORIDE CRYS ER 20 MEQ PO TBCR
40.0000 meq | EXTENDED_RELEASE_TABLET | Freq: Once | ORAL | Status: AC
  Administered 2016-06-15: 40 meq via ORAL
  Filled 2016-06-15: qty 2

## 2016-06-15 MED ORDER — POTASSIUM CHLORIDE 2 MEQ/ML IV SOLN
30.0000 meq | Freq: Once | INTRAVENOUS | Status: AC
  Administered 2016-06-15: 30 meq via INTRAVENOUS
  Filled 2016-06-15: qty 15

## 2016-06-15 MED ORDER — SODIUM CHLORIDE 0.9% FLUSH
10.0000 mL | Freq: Two times a day (BID) | INTRAVENOUS | Status: DC
  Administered 2016-06-15 – 2016-06-18 (×7): 10 mL
  Administered 2016-06-19: 20 mL

## 2016-06-15 NOTE — Progress Notes (Signed)
Patient taken off BiPAP for time after ABG was within normal limits for her history and chronic issues. Patient communicating well and alert to time and place. Patient being transported to another room in 4N unit and will be placed back on BIPAP until am time per [facility] MD.

## 2016-06-15 NOTE — Care Management Note (Addendum)
Case Management Note Previous Note Created by Lawerance Sabal 06/11/16  Patient Details  Name: Sierra Hampton MRN: [id number] Date of Birth: [birth year]  Subjective/Objective:                  From home alone. /60 y.o. female with history of hypertension, COPD, and asthma who presents to the ED for evaluation of dyspnea. This patient states that she has been experiencing dyspnea over the last 3 days that has worsened and is now intolerable.  Action/Plan: Admit status INPATIENT (COPD exacerbation); anticipate discharge HOME WITH SELF CARE VS HOME HEALTH.  Addendum 4/6 09:50 Spoke with patient at the bedside. She states that she lives at home with her 75 year old daughter who provides her transportation to office visits. She uses [employer] and they deliver her medications to her. She states she has a HHA through Medicaid that provides 3-4 hours of care 2 days a week. She states that she gets her oxygen through [facility]. If patient requires oxygen for transport, please call [employer] at [phone]. Pta patient used O2 at night and PRN. Patient states she has a hospital bed, electric WC, shower seat at home.      Expected Discharge Date:   (unsure)               Expected Discharge Plan:  Home/Self Care  In-House Referral:  NA  Discharge planning Services  CM Consult  Post Acute Care Choice:    Choice offered to:     DME Arranged:    DME Agency:     HH Arranged:    HH Agency:     Status of Service:  In process, will continue to follow  If discussed at Long Length of Stay Meetings, dates discussed:    Additional Comments: 06/15/2016 Pt is on home oxygen at night and as needed.  Pt states she weighs herself daily, not adherent to low salt diet - pt educated on the importance of low sodium diet  Pt extremely sleepy and CM was not able to complete assessment - CM unable to reach daughter via phone- CM will attempt to assess pt at a later time.  SNF recommended - CM consulted  CSW Cherylann Parr, RN 06/15/2016, 2:25 PM

## 2016-06-15 NOTE — Progress Notes (Addendum)
PROGRESS NOTE    Sierra Hampton  NWG:[id number] DOB: August 31, 1955 DOA: 06/11/2016 PCP: Sierra Blood, MD   Chief Complaint  Patient presents with  . Shortness of Breath    Brief Narrative:  HPI On 06/11/2016 by Ms. Sierra Smothers, NP Sierra Hampton is a very pleasant 61 y.o. female with medical history significant of chronic respiratory failure related to asthma and COPD on home oxygen, obesity, hypertension, presents to emergency department with chief complaint of worsening shortness of breath. Initial evaluation reveals acute on chronic respiratory failure likely related to COPD exacerbation and possible mild acute heart failure.  Information is obtained from the patient. She states she is on 2 L of oxygen at home at night she uses nebulizers and inhalers. 3 days ago she developed gradual worsening shortness of breath. She used her inhalers and nebulizers with no improvement. She states moving about makes it worse. She denies headache dizziness syncope or near-syncope. She denies chest pain palpitation cough lower extremity edema. She denies abdominal pain nausea vomiting diarrhea constipation melena. She denies dysuria hematuria frequency or urgency. She reports she stopped smoking in 2011. Assessment & Plan   Acute on chronic hypoxic/hypercarbic respiratory failure -Presentation is multifactorial, primarily etiology appears acute on chronic diastolic dysfunction with underlying COPD and OSA. -Patient's baseline 2 L oxygen nasal cannula at night as needed.  -Chest x-ray with vascular interstitial prominence may represent mild congestive heart or no focal airspace consolidation no effusion.  -She has increased oxygen demand with ambulation. -Continue Steroids (transitioned to prednisone), nebulizer treatments, Lasix, flutter valve -Echocardiogram with normal EF, diastolic dysfunction. -On 06/14/2016 ABG shows severe hypercarbia. PCCM consulted. Continue BiPAP at present.  -Worsening hypercarbia  possibly secondary to chronic opioids which were discontinued -ABG today showed improvement.  -PCCM feels patient may have chronic hypercarbia (CO2 in the 60s) and recommended outpatient PFTs  COPD exacerbation -History of COPD and asthma -uses oxygen at 2 L at night as needed reportedly.  -chest x-ray as noted above -Continue treatment plan as above -Likely benefit from outpatient pulmonary function test  Acute on chronic diastolic heart failure -No history of heart failure, BNP elevated to 37  -Chest x-ray as noted above  -Continue IV Lasix, monitor intake and output and daily weights -Lisinopril currently held due to acute kidney injury  -Echocardiogram normal EF, no wall motion abnormalities.  Acute kidney injury  -Baseline creatinine 2014 was approximately 0.9, currently 0.98 -Continue to monitor BMP  Elevated troponin -Likely demand, troponin peaked at 0.09 -Denies chest pain  Essential Hypertension -Continue amlodipine -Lisinopril, HCTZ held due to possible AKI -Will add on hydralazine   Morbid Obesity -BMI 70 -nutritional consult  Obstructive sleep apnea  -Review indicates had a sleep study in October 2017. Results noted obstructive sleep apnea and nocturnal hypoxemia. Note indicates moderate oxygen desaturation and EKG with PVCs, recommended CPAP. Patient noncompliant at home.  Physical deconditioning -PT consulted and recommended SNF -OT pending   DVT Prophylaxis  lovenox  Code Status: Full  Family Communication: None at bedside  Disposition Plan: Admitted. Continue to monitor in stepdown. Improvement in respiratory status as well as mental status  Consultants PCCM  Procedures  Echocardiogram  Antibiotics   Anti-infectives    None      Subjective:   Sierra Hampton seen and examined today.  Currently on BiPAP. Feels breathing has improved. Denies chest, abdominal pain, N/V/C/D.   Objective:   Vitals:   06/15/16 0400 06/15/16 0432  06/15/16 0551 06/15/16 0828  BP: Marland Kitchen)  126/56   (!) 184/105  Pulse: 72   81  Resp: (!) 22   19  Temp: 99.9 F (37.7 C)   99.1 F (37.3 C)  TempSrc:    Axillary  SpO2: (!) 89%  93% 94%  Weight:  (!) 195.5 kg (431 lb)    Height:        Intake/Output Summary (Last 24 hours) at 06/15/16 1120 Last data filed at 06/15/16 1007  Gross per 24 hour  Intake              380 ml  Output             8525 ml  Net            -8145 ml   Filed Weights   06/13/16 0648 06/14/16 0746 06/15/16 0432  Weight: (!) 210 kg (462 lb 15.5 oz) (!) 209.6 kg (462 lb) (!) 195.5 kg (431 lb)    Exam  General: Well developed, well nourished, NAD, appears stated age, Morbidly obese  HEENT: NCAT, membranes moist.   Neck: Supple, no masses. Unable to assess JVD due to body habitus  Cardiovascular: S1 S2 auscultated, RRR, no murmurs  Respiratory: Breath sounds diminished however no wheezing, on BIPAP  Abdomen: Soft, Obese, nontender, nondistended, + bowel sounds  Extremities: warm dry without cyanosis clubbing or edema  Neuro: AAOx3, nonfocal  Psych: Normal affect and demeanor   Data Reviewed: I have personally reviewed following labs and imaging studies  CBC:  Recent Labs Lab 06/11/16 0110 06/12/16 0926 06/13/16 0557 06/14/16 0611 06/15/16 0315  WBC 8.5 13.2* 15.3* 12.6* 8.7  HGB 11.9* 12.2 11.4* 11.4* 11.0*  HCT 38.7 39.9 38.2 38.1 36.3  MCV 94.4 95.2 97.7 97.2 96.0  PLT 231 238 252 203 189   Basic Metabolic Panel:  Recent Labs Lab 06/11/16 0110 06/12/16 0926 06/13/16 0557 06/14/16 0611 06/15/16 0315  NA 140 141 142 142 145  K 3.8 4.0 3.7 3.9 3.0*  CL 104 105 101 104 95*  CO2 39*  GLUCOSE 96 119* 104* 101* 99  BUN 11 19 25* 25* 16  CREATININE 1.06* 1.05* 1.17* 1.03* 0.98  CALCIUM 8.8* 9.0 8.7* 8.5* 8.4*  MG  --   --   --   --  1.9   GFR: Estimated Creatinine Clearance: 115.1 mL/min (by C-G formula based on SCr of 0.98 mg/dL). Liver Function Tests:  Recent  Labs Lab 06/11/16 0110 06/14/16 0611  AST 29 44*  ALT 30 68*  ALKPHOS 94 86  BILITOT 0.5 0.4  PROT 7.3 7.3  ALBUMIN 3.5 3.0*   No results for input(s): LIPASE, AMYLASE in the last 168 hours.  Recent Labs Lab 06/14/16 0814  AMMONIA 57*   Coagulation Profile:  Recent Labs Lab 06/14/16 0611  INR 1.03   Cardiac Enzymes:  Recent Labs Lab 06/11/16 0250 06/11/16 0724 06/11/16 1233 06/11/16 1840  TROPONINI 0.05* 0.03* 0.03* 0.09*   BNP (last 3 results) No results for input(s): PROBNP in the last 8760 hours. HbA1C: No results for input(s): HGBA1C in the last 72 hours. CBG:  Recent Labs Lab 06/14/16 1625 06/14/16 1956 06/14/16 2356 06/15/16 0342 06/15/16 0755  GLUCAP 90 97 89 91 95   Lipid Profile: No results for input(s): CHOL, HDL, LDLCALC, TRIG, CHOLHDL, LDLDIRECT in the last 72 hours. Thyroid Function Tests:  Recent Labs  06/14/16 0814 06/14/16 1102  TSH 11.463*  --   FREET4  --  0.70   Anemia Panel:  Recent Labs  06/14/16 0814  VITAMINB12 564   Urine analysis:    Component Value Date/Time   COLORURINE COLORLESS (A) 06/11/2016 0810   APPEARANCEUR CLEAR 06/11/2016 0810   LABSPEC 1.004 (L) 06/11/2016 0810   PHURINE 6.0 06/11/2016 0810   GLUCOSEU NEGATIVE 06/11/2016 0810   HGBUR NEGATIVE 06/11/2016 0810   BILIRUBINUR NEGATIVE 06/11/2016 0810   KETONESUR NEGATIVE 06/11/2016 0810   PROTEINUR NEGATIVE 06/11/2016 0810   UROBILINOGEN 2.0 (H) 11/13/2012 1410   NITRITE NEGATIVE 06/11/2016 0810   LEUKOCYTESUR NEGATIVE 06/11/2016 0810   Sepsis Labs: (procalcitonin:4,lacticidven:4)  ) Recent Results (from the past 240 hour(s))  MRSA PCR Screening     Status: None   Collection Time: 06/14/16  1:33 PM  Result Value Ref Range Status   MRSA by PCR NEGATIVE NEGATIVE Final    Comment:        The GeneXpert MRSA Assay (FDA approved for NASAL specimens only), is one component of a comprehensive MRSA colonization surveillance program. It  is not intended to diagnose MRSA infection nor to guide or monitor treatment for MRSA infections.       Radiology Studies: Ct Head Wo Contrast  Result Date: 06/14/2016 CLINICAL DATA:  Patient is sleepy and lethargic. Shortness of breath. EXAM: CT HEAD WITHOUT CONTRAST TECHNIQUE: Contiguous axial images were obtained from the base of the skull through the vertex without intravenous contrast. COMPARISON:  February 02, 2016 FINDINGS: The study is limited through the cerebellum and lower brain due to beam hardening and streak artifact. I spoke to the technologist who indicated these are the best possible images given patient's morbid obesity and condition. Brain: No subdural, epidural, or subarachnoid hemorrhage. The basal cisterns are patent. Cerebellum is not well evaluated but is grossly unremarkable. The brainstem is also poorly evaluated without obvious abnormality. No midline shift. No acute cortical ischemia or infarct. Vascular: No hyperdense vessel or unexpected calcification. Skull: Normal. Negative for fracture or focal lesion. Sinuses/Orbits: Mucosal thickening is seen in the ethmoid and sphenoid sinuses with no air-fluid levels. The mastoid air cells and middle ears are well aerated. Other: None. IMPRESSION: 1. Significantly limited study as above with no evidence of hemorrhage, ischemia, or infarct on provided images. Electronically Signed   By: Gerome Sam III M.D   On: 06/14/2016 08:02     Scheduled Meds: . amLODipine  10 mg Oral Daily  . budesonide (PULMICORT) nebulizer solution  0.5 mg Nebulization BID  . chlorhexidine  15 mL Mouth Rinse BID  . enoxaparin (LOVENOX) injection  100 mg Subcutaneous Daily  . escitalopram  20 mg Oral Daily  . estrogens (conjugated)  0.625 mg Oral Daily   And  . medroxyPROGESTERone  2.5 mg Oral Daily  . furosemide  80 mg Intravenous Q8H  . insulin aspart  0-20 Units Subcutaneous Q4H  . ipratropium-albuterol  3 mL Nebulization QID  . mouth rinse   15 mL Mouth Rinse q12n4p  . pantoprazole  40 mg Oral Daily  . potassium chloride (KCL MULTIRUN) 30 mEq in 265 mL IVPB  30 mEq Intravenous Once  . predniSONE  50 mg Oral Q breakfast  . sodium chloride flush  3 mL Intravenous Q12H  . sodium chloride flush  3 mL Intravenous Q12H   Continuous Infusions:   LOS: 4 days   Time Spent in minutes   35 minutes  Irvine Glorioso D.O. on 06/15/2016 at 11:12 AM  Between 7am to 7pm - Pager - [phone]  After 7pm go to [email or web address] - password  [email or web address]  And look for the night coverage person covering for me after hours  [facility] Office  [phone]

## 2016-06-15 NOTE — Progress Notes (Signed)
Pt SBP's consistently >160's. Contacted provider in regards to possible PRN. No orders at this time. Will continue to monitor pt.

## 2016-06-15 NOTE — Progress Notes (Signed)
OT Cancellation Note  Patient Details Name: Sierra Hampton MRN: [id number] DOB: [birth year]   Cancelled Treatment:    Reason Eval/Treat Not Completed: Patient's level of consciousness Pt lethargic and unable to sustain arousal. Pt completed transfer back to bed with PT prior to session  Felecia Shelling   OTR/L Pager: [phone] Office: [phone] .  06/15/2016, 2:28 PM

## 2016-06-15 NOTE — Progress Notes (Signed)
Peripherally Inserted Central Catheter/Midline Placement  The IV Nurse has discussed with the patient and/or persons authorized to consent for the patient, the purpose of this procedure and the potential benefits and risks involved with this procedure.  The benefits include less needle sticks, lab draws from the catheter, and the patient may be discharged home with the catheter. Risks include, but not limited to, infection, bleeding, blood clot (thrombus formation), and puncture of an artery; nerve damage and irregular heartbeat and possibility to perform a PICC exchange if needed/ordered by physician.  Alternatives to this procedure were also discussed.  Bard Power PICC patient education guide, fact sheet on infection prevention and patient information card has been provided to patient /or left at bedside.    Pt. Has woken up and at present alert and orientated x 3,   PICC/Midline Placement Documentation  PICC Double Lumen 06/15/16 PICC Cephalic 48 cm 0 cm (Active)  Indication for Insertion or Continuance of Line Poor Vasculature-patient has had multiple peripheral attempts or PIVs lasting less than 24 hours 06/15/2016  9:13 PM  Exposed Catheter (cm) 0 cm 06/15/2016  9:13 PM  Site Assessment Clean;Dry;Intact 06/15/2016  9:13 PM  Lumen #1 Status Flushed;Saline locked;Blood return noted 06/15/2016  9:13 PM  Lumen #2 Status Flushed;Saline locked;Blood return noted 06/15/2016  9:13 PM  Dressing Type Transparent 06/15/2016  9:13 PM  Dressing Status Clean;Dry;Intact;Antimicrobial disc in place 06/15/2016  9:13 PM  Dressing Change Due 06/22/16 06/15/2016  9:13 PM       Ethelda Chick 06/15/2016, 9:15 PM

## 2016-06-15 NOTE — Progress Notes (Signed)
PULMONARY / CRITICAL CARE MEDICINE   Name: Sierra Hampton MRN: [id number] DOB: [birth year]    ADMISSION DATE:  06/11/2016 CONSULTATION DATE:  06/14/2016   REFERRING MD:  Dr. Allena Katz   CHIEF COMPLAINT:  Respiratory Failure   HISTORY OF PRESENT ILLNESS:   Sierra Hampton is a very pleasant 61 y.o. female with medical history significant of chronic respiratory failure related to asthma and COPD on home oxygen, obesity, hypertension, presents to emergency department with chief complaint of worsening shortness of breath for 3 days. Initial evaluation reveals acute on chronic respiratory failure likely related to COPD exacerbation and possible mild acute heart failure.  Patient has sleep apnea for which she is not compliant to CPAP therapy.  She was admitted to the floors and was treated as CHF diastolic heart failure exacerbation/pulmonary edema with diuresis. Patient also has chronic pain issues for which she was getting pain medicines when necessary. She was noted to be sleepy yesterday which worsened today and she was noted to be lethargic this morning.  ABG this morning showed7.28/69/38 on RA. He was placed on BiPAP 18/6 40% and rpt ABG with 7.24/83/89. She is starting to wake up however. Goes into episodes of sleepiness.    SUBJECTIVE:  Slowly improved overnight.  Woke up eventually.  Bipap was removed this morning, now on 4L o2, O2 sats 90-91% Denies subjective complaints.    VITAL SIGNS: BP (!) 184/105 (BP Location: Right Wrist)   Pulse 81   Temp 99.1 F (37.3 C) (Axillary)   Resp 19   Ht  (1.727 m)   Wt (!) 195.5 kg (431 lb)   SpO2 94%   BMI 65.53 kg/m   HEMODYNAMICS:    VENTILATOR SETTINGS: Vent Mode: PCV;BIPAP FiO2 (%):  [30 %] 30 % Set Rate:  [14 bmp-18 bmp] 14 bmp PEEP:  [6 cmH20] 6 cmH20 Pressure Support:  [12 cmH20] 12 cmH20  INTAKE / OUTPUT: I/O last 3 completed shifts: In: -  Out: 7225 [Urine:7225]  PHYSICAL EXAMINATION: General:  Morbidly obese,  awake, oriented x3, follows commands. NAD.  Neuro:  CN grossly intact. (-) lateralizing signs.  HEENT:  Hard to decipher neck veins.  Crowded airway.  Cardiovascular:  Good S1-S2, no S3 murmur rub or gallop. Lungs:  Decreased sounds in both lung fields. Crackles at the bases but less on 4/9. (-) wheezing Abdomen:  Obese. (+) pannus.  (+) BS, soft, NT. (-) masses.  Musculoskeletal:  Gr 2 edema.   Skin:  Warm, dry. (-) rash  LABS:  BMET  Recent Labs Lab 06/13/16 0557 06/14/16 0611 06/15/16 0315  NA 142 142 145  K 3.7 3.9 3.0*  CL 101 104 95*  CO2 29 28 39*  BUN 25* 25* 16  CREATININE 1.17* 1.03* 0.98  GLUCOSE 104* 101* 99    Electrolytes  Recent Labs Lab 06/13/16 0557 06/14/16 0611 06/15/16 0315  CALCIUM 8.7* 8.5* 8.4*  MG  --   --  1.9    CBC  Recent Labs Lab 06/13/16 0557 06/14/16 0611 06/15/16 0315  WBC 15.3* 12.6* 8.7  HGB 11.4* 11.4* 11.0*  HCT 38.2 38.1 36.3  PLT 252 203 189    Coag's  Recent Labs Lab 06/14/16 0611  INR 1.03    Sepsis Markers  Recent Labs Lab 06/14/16 0814 06/14/16 1105 06/14/16 1452 06/15/16 0315  LATICACIDVEN 0.8 0.6  --   --   PROCALCITON  --   --  0.17 0.15    ABG  Recent Labs Lab  06/14/16 1110 06/14/16 1505 06/15/16 0302  PHART 7.237* 7.291* 7.433  PCO2ART 82.9* 72.2* 61.1*  PO2ART 88.7 62.4* 68.8*    Liver Enzymes  Recent Labs Lab 06/11/16 0110 06/14/16 0611  AST 29 44*  ALT 30 68*  ALKPHOS 94 86  BILITOT 0.5 0.4  ALBUMIN 3.5 3.0*    Cardiac Enzymes  Recent Labs Lab 06/11/16 0724 06/11/16 1233 06/11/16 1840  TROPONINI 0.03* 0.03* 0.09*    Glucose  Recent Labs Lab 06/14/16 1449 06/14/16 1625 06/14/16 1956 06/14/16 2356 06/15/16 0342 06/15/16 0755  GLUCAP 89 90 97 89 91 95    Imaging No results found.   STUDIES:  2Decho 4/6  Hypokinesis, EF 55%  CULTURES: MRSA 4/8 >>  ANTIBIOTICS: none  SIGNIFICANT EVENTS: 4/5 admitted for acute on chronic dyspnea, treated with  diuresis 4/8 transfrred to ICU/SDU for resp depression/hypercapnea. Tried bipap  LINES/TUBES:   DISCUSSION: 81F, with OSA, likely severe, not on cpap, with asthma/COPD, on home o2, admitted with worsening SOB.  Treated as pulm edema.  Hypercapnea worsened by pain meds so she was tried on bipap and transferred to ICU/SDU.    ASSESSMENT / PLAN:  Acute on chronic hypoxemic hypercapnic respiratory failure secondary to unable to protect airway with sedating/pain meds + Untreated OSA + OHS + Pulm edema. No evidence for PNA at this point.  She endorses asthma/copd. Not in exacerbation.  Morbid Obesity  - She is definitely improved and is at baseline I think as far as her resp status is concerned.  Her Co2 lives in the 60s chronically.  ABG this am is OK.  - Cont Bipap at HS and prn when sleeping during daytime.  - On 4 L O2 [place] >> try to decrease O2 to keep o2 sats > 88% - She needs a repeat sleep study as an outpt.  She needs to be on cpap/bipap as outpt otherwise she will be back the minute she leaves.  I told her that she needs to be on cpap or bipap and I am not sure if she is comprehending.  - Minimize pain meds.  - Cont neb meds. She endorses asthma/COPD.  She will need PFTs as outpt.  - Cont with diuresis - Taper off prednisone. - aspiration precaution - DVT prophylaxis - I think she needs placement on discharge.    Pollie Meyer, MD Pulmonary and Critical Care Medicine [facility] Pager: [phone] After 3 pm or if no response, call [phone]  06/15/2016, 10:31 AM

## 2016-06-15 NOTE — Progress Notes (Signed)
Pts morning ABG improving with pH 7.43 pCO2 61 pO2 68.8. Patient more alert with intermittent confusion.  TRH called to verify the move was appropriate. Doctor agreed that patient can move to stepdown unit.

## 2016-06-15 NOTE — Progress Notes (Signed)
06/15/16 1200  PT Visit Information  Last PT Received On 06/15/16  Assistance Needed +2 (for safety)  History of Present Illness Sierra Hampton a very pleasant 61 y.o.femalewith medical history significant of chronic respiratory failure related to asthma and COPD on home oxygen, obesity, hypertension, presents to emergency department with chief complaint of worsening shortness of breath. Initial evaluation reveals acute on chronic respiratory failure likely related to COPD exacerbation and possible mild acute heart failure.PMH:  COPD, CHF, obesity,HTN, and OSA.   Precautions  Precautions Fall  Precaution Comments watch O2  Restrictions  Weight Bearing Restrictions No  Pain Assessment  Pain Assessment No/denies pain  Cognition  Arousal/Alertness Awake/alert  Behavior During Therapy WFL for tasks assessed/performed  Overall Cognitive Status Within Functional Limits for tasks assessed  Bed Mobility  Overal bed mobility Needs Assistance  Bed Mobility Supine to Sit;Sit to Supine  Sit to supine Min assist;+2 for physical assistance  General bed mobility comments Supervision for safety, Pt did require assist to initiate LES onto bed.    Transfers  Overall transfer level Needs assistance  Equipment used Straight cane  Transfers Sit to/from [employer] Transfers  Sit to Stand Min assist;+2 safety/equipment  Stand pivot transfers Min assist;+2 safety/equipment  General transfer comment Pt uses momentum to power up.  Pt needed 2 attempts to come to standing as first attempt she did not anterior translate enough to stand fully.  Pt needs steadying assist and cues to weight shift to step around. Plopped back onto bed.    Balance  Overall balance assessment Needs assistance  Sitting-balance support No upper extremity supported;Feet supported  Sitting balance-Leahy Scale Fair  Standing balance support During functional activity;Single extremity supported  Standing balance-Leahy  Scale Poor  Standing balance comment relies on cane and 1 person assist holding PT's arm to stand statically.   Exercises  Exercises General Lower Extremity  General Exercises - Lower Extremity  Ankle Circles/Pumps AROM;Both;10 reps;Supine  Heel Slides AAROM;Both;10 reps;Supine  PT - End of Session  Equipment Utilized During Treatment Oxygen;Gait belt  Activity Tolerance Patient limited by fatigue  Patient left in bed;with call bell/phone within reach;with nursing/sitter in room  Nurse Communication Mobility status  PT - Assessment/Plan  PT Plan Current plan remains appropriate  PT Visit Diagnosis Muscle weakness (generalized) (M62.81)  PT Frequency (ACUTE ONLY) Min 3X/week  Follow Up Recommendations SNF;Supervision/Assistance - 24 hour  PT equipment None recommended by PT  AM-PAC PT "6 Clicks" Daily Activity Outcome Measure  Difficulty turning over in bed (including adjusting bedclothes, sheets and blankets)? 4  Difficulty moving from lying on back to sitting on the side of the bed?  3  Difficulty sitting down on and standing up from a chair with arms (e.g., wheelchair, bedside commode, etc,.)? 2  Help needed moving to and from a bed to chair (including a wheelchair)? 3  Help needed walking in hospital room? 1  Help needed climbing 3-5 steps with a railing?  1  6 Click Score 14  Mobility G Code  CK  PT Goal Progression  Progress towards PT goals Progressing toward goals  PT Time Calculation  PT Start Time (ACUTE ONLY) 0958  PT Stop Time (ACUTE ONLY) 1021  PT Time Calculation (min) (ACUTE ONLY) 23 min  PT G-Codes **NOT FOR INPATIENT CLASS**  Functional Assessment Tool Used AM-PAC 6 Clicks Basic Mobility  Functional Limitation Mobility: Walking and moving around  Mobility: Walking and Moving Around Current Status (I6962) CI  Mobility: Walking and  Moving Around Goal Status (N6295) CI  PT General Charges  $$ ACUTE PT VISIT 1 Procedure  PT Treatments  $Therapeutic Exercise 8-22  mins  $Therapeutic Activity 8-22 mins  Pt nurse asked PT to assist pt back to bed.  Showed nurse and NT how pt uses cane to transfer back.  Took incr time to get pt to go back to bed.  She kept saying "I want to go home now."   Could not redirect pt.  She was focused on going home and would not go ahead and lie down on bed. Desat to 87% with 4LO2 once lying down until raised HOB.  Pt sats on departure 95% on 4LO2.  Will continue PT.  [facility] Acute [facility] [phone] (pager)

## 2016-06-15 NOTE — Progress Notes (Signed)
Attempt to call daughter for PICC consent by phone.  No answer.  Pervious nurse reports attempted earlier today.  Will attempt again later.

## 2016-06-15 NOTE — Progress Notes (Signed)
Attempted to call daughter regarding patient's transfer to 4N11. Unable to leave message.

## 2016-06-15 NOTE — Progress Notes (Signed)
qPhysical Therapy Treatment Patient Details Name: Sierra Hampton MRN: [id number] DOB: [birth year] Today's Date: 06/15/2016    History of Present Illness Sierra Hampton a very pleasant 61 y.o.femalewith medical history significant of chronic respiratory failure related to asthma and COPD on home oxygen, obesity, hypertension, presents to emergency department with chief complaint of worsening shortness of breath. Initial evaluation reveals acute on chronic respiratory failure likely related to COPD exacerbation and possible mild acute heart failure.PMH:  COPD, CHF, obesity,HTN, and OSA.     PT Comments    Pt admitted with above diagnosis. Pt currently with functional limitations due to balance and endurance deficits. Pt was able to stand and pivot with +2 min assist with cane to chair.  Pt Needs assist for safety as she is impulsive and unsteady at times due to her impulsivity.  Pt would benefit from SNF at d/c.  Pt desats to mid 80's without O2.  O2 sats 94-95% on 5LO2 with transfer.  Other VSS.  Pt will benefit from skilled PT to increase their independence and safety with mobility to allow discharge to the venue listed below.     Follow Up Recommendations  SNF;Supervision/Assistance - 24 hour     Equipment Recommendations  None recommended by PT    Recommendations for Other Services       Precautions / Restrictions Precautions Precautions: Fall Precaution Comments: watch O2 Restrictions Weight Bearing Restrictions: No    Mobility  Bed Mobility Overal bed mobility: Needs Assistance Bed Mobility: Supine to Sit;Sit to Supine     Supine to sit: Supervision     General bed mobility comments: Supervision for safety, Pt did require a hand to pull up on with min  physical assist required to get OOB.    Transfers Overall transfer level: Needs assistance Equipment used: Straight cane Transfers: Sit to/from [employer] Sit to Stand: Min assist;+2  safety/equipment Stand pivot transfers: Min assist;+2 safety/equipment       General transfer comment: Pt uses momentum to power up.  Pt needed 2 attempts to come to standing as first attempt she did not anterior translate enough to stand fully.  Pt needs steadying assist and cues to weight shift to step around.   Ambulation/Gait                 Stairs            Wheelchair Mobility    Modified Rankin (Stroke Patients Only)       Balance Overall balance assessment: Needs assistance Sitting-balance support: No upper extremity supported;Feet supported Sitting balance-Leahy Scale: Fair     Standing balance support: During functional activity;Single extremity supported Standing balance-Leahy Scale: Poor Standing balance comment: relies on cane and 1 person assist holding PT's arm to stand statically.                             Cognition Arousal/Alertness: Awake/alert Behavior During Therapy: WFL for tasks assessed/performed Overall Cognitive Status: Within Functional Limits for tasks assessed                                        Exercises General Exercises - Lower Extremity Ankle Circles/Pumps: AROM;Both;10 reps;Supine Heel Slides: AAROM;Both;10 reps;Supine    General Comments        Pertinent Vitals/Pain Pain Assessment: No/denies pain    Home Living  Prior Function            PT Goals (current goals can now be found in the care plan section) Progress towards PT goals: Progressing toward goals    Frequency    Min 3X/week      PT Plan Current plan remains appropriate    Co-evaluation             End of Session Equipment Utilized During Treatment: Oxygen;Gait belt Activity Tolerance: Patient limited by fatigue Patient left: with call bell/phone within reach;in chair;with chair alarm set Nurse Communication: Mobility status PT Visit Diagnosis: Muscle weakness (generalized)  (M62.81)     Time: 2130-8657 PT Time Calculation (min) (ACUTE ONLY): 23 min  Charges:  $Therapeutic Exercise: 8-22 mins $Therapeutic Activity: 8-22 mins                    G Codes:       Aurelius Gildersleeve,PT Acute Rehabilitation [phone] (pager)    Berline Lopes 06/15/2016, 11:08 AM

## 2016-06-16 LAB — CBC
HCT: 39.1 % (ref 36.0–46.0)
Hemoglobin: 12 g/dL (ref 12.0–15.0)
MCH: 29.3 pg (ref 26.0–34.0)
MCHC: 30.7 g/dL (ref 30.0–36.0)
MCV: 95.4 fL (ref 78.0–100.0)
PLATELETS: 213 10*3/uL (ref 150–400)
RBC: 4.1 MIL/uL (ref 3.87–5.11)
RDW: 13.7 % (ref 11.5–15.5)
WBC: 10.9 10*3/uL — AB (ref 4.0–10.5)

## 2016-06-16 LAB — BASIC METABOLIC PANEL
ANION GAP: 12 (ref 5–15)
BUN: 16 mg/dL (ref 6–20)
CO2: 42 mmol/L — ABNORMAL HIGH (ref 22–32)
Calcium: 8.6 mg/dL — ABNORMAL LOW (ref 8.9–10.3)
Chloride: 87 mmol/L — ABNORMAL LOW (ref 101–111)
Creatinine, Ser: 0.91 mg/dL (ref 0.44–1.00)
Glucose, Bld: 98 mg/dL (ref 65–99)
POTASSIUM: 2.9 mmol/L — AB (ref 3.5–5.1)
SODIUM: 141 mmol/L (ref 135–145)

## 2016-06-16 LAB — GLUCOSE, CAPILLARY
GLUCOSE-CAPILLARY: 104 mg/dL — AB (ref 65–99)
GLUCOSE-CAPILLARY: 102 mg/dL — AB (ref 65–99)
GLUCOSE-CAPILLARY: 104 mg/dL — AB (ref 65–99)
GLUCOSE-CAPILLARY: 157 mg/dL — AB (ref 65–99)
GLUCOSE-CAPILLARY: 185 mg/dL — AB (ref 65–99)
Glucose-Capillary: 127 mg/dL — ABNORMAL HIGH (ref 65–99)

## 2016-06-16 LAB — PROCALCITONIN

## 2016-06-16 MED ORDER — POTASSIUM CHLORIDE CRYS ER 20 MEQ PO TBCR
40.0000 meq | EXTENDED_RELEASE_TABLET | Freq: Two times a day (BID) | ORAL | Status: AC
  Administered 2016-06-16 (×2): 40 meq via ORAL
  Filled 2016-06-16 (×2): qty 2

## 2016-06-16 MED ORDER — LORATADINE 10 MG PO TABS
10.0000 mg | ORAL_TABLET | Freq: Every day | ORAL | Status: DC | PRN
  Administered 2016-06-16 – 2016-06-18 (×2): 10 mg via ORAL
  Filled 2016-06-16 (×2): qty 1

## 2016-06-16 MED ORDER — PREDNISONE 20 MG PO TABS
40.0000 mg | ORAL_TABLET | Freq: Every day | ORAL | Status: DC
  Administered 2016-06-17: 40 mg via ORAL
  Filled 2016-06-16: qty 2

## 2016-06-16 NOTE — Progress Notes (Signed)
Occupational Therapy Treatment Patient Details Name: Sierra Hampton MRN: [id number] DOB: 1955/07/09 Today's Date: 06/16/2016    History of present illness Sierra Hampton a 61 y.o.femalewith medical history significant of chronic respiratory failure related to asthma and COPD on home oxygen, obesity, hypertension, presents to emergency department with chief complaint of worsening shortness of breath. Initial evaluation reveals acute on chronic respiratory failure likely related to COPD exacerbation and possible mild acute heart failure.PMH:  COPD, CHF, obesity,HTN, and OSA.    OT comments  Limited participation due to lethargy. Nsg states pt has not had anything for pain and sat up in chair this am. Attempted to further assess DME and AE needs. Will follow up to address established goals and facilitate safe DC home.   Follow Up Recommendations  Home health OT;Supervision/Assistance - 24 hour;Other (comment)    Equipment Recommendations  None recommended by OT (pt staes she has a BSC and shower chair)    Recommendations for Other Services PT consult    Precautions / Restrictions Precautions Precautions: Fall Precaution Comments: watch O2       Mobility Bed Mobility Overal bed mobility: Needs Assistance Bed Mobility: Supine to Sit     Supine to sit: Supervision     General bed mobility comments: Heavy use of momentum and bed rails. Pt staes she has a hospital bed at home  Transfers                 General transfer comment: unable to assess due to lethargy  Pt falling asleep sitting up    Balance                                           ADL either performed or assessed with clinical judgement   ADL Overall ADL's : Needs assistance/impaired                                       General ADL Comments: Completed bed mobility to sit EOB. Pt continued to fall asleep sitting EOB. Pt answered questions regarding DME and stated  she has everything she needs. Unable ot determine is she has AE as she continued to fall asleep. When attempting to arouse again, pt stated "why you bothering me, this is my naptime".  Pt given information on energy conservation to review next session.   Pt states she is able to complete her pericare after toileting without difficulty and is able to complete her LB ADL. Will further assess when more alert.      Vision       Perception     Praxis      Cognition Arousal/Alertness: Lethargic Behavior During Therapy: Agitated Overall Cognitive Status: Difficult to assess                                          Exercises     Shoulder Instructions       General Comments      Pertinent Vitals/ Pain       Pain Assessment: No/denies pain  Home Living  Prior Functioning/Environment              Frequency  Min 2X/week        Progress Toward Goals  OT Goals(current goals can now be found in the care plan section)  Progress towards OT goals: Not progressing toward goals - comment (due to lethargy)  Acute Rehab OT Goals Patient Stated Goal: to sleep OT Goal Formulation: Patient unable to participate in goal setting Time For Goal Achievement: 06/25/16 Potential to Achieve Goals: Good ADL Goals Pt Will Perform Lower Body Bathing: with min assist;with adaptive equipment;sit to/from stand Pt Will Perform Lower Body Dressing: with min assist;with adaptive equipment;sit to/from stand Pt Will Transfer to Toilet: with supervision;ambulating;bedside commode Pt Will Perform Toileting - Clothing Manipulation and hygiene: with supervision;sit to/from stand;with adaptive equipment Pt Will Perform Tub/Shower Transfer: Tub transfer;ambulating;tub bench;rolling walker;with min guard assist Additional ADL Goal #1: Pt will independently verbalize 3 energy conservation and use during ADL.  Plan Discharge plan  remains appropriate    Co-evaluation                 End of Session Equipment Utilized During Treatment: Oxygen;Other (comment)  OT Visit Diagnosis: Unsteadiness on feet (R26.81);Other abnormalities of gait and mobility (R26.89)   Activity Tolerance Patient limited by lethargy   Patient Left in bed;with call bell/phone within reach   Nurse Communication Other (comment) (level of arousal)        Time: 1610-9604 OT Time Calculation (min): 16 min  Charges: OT General Charges $OT Visit: 1 Procedure OT Treatments $Self Care/Home Management : 8-22 mins  [facility], OT/L  [phone] 06/16/2016   Abas Leicht,HILLARY 06/16/2016, 2:47 PM

## 2016-06-16 NOTE — Progress Notes (Signed)
RT attempted to place patient on BiPAP QHS however, patient ripped mask off as soon as it was strapped on. RT explained to patient the importance of wearing BiPAP at night but patient still did not want to wear. RT will continue to monitor.

## 2016-06-16 NOTE — Progress Notes (Addendum)
PULMONARY / CRITICAL CARE MEDICINE   Name: Sierra Hampton MRN: [id number] DOB: 08/16/1955    ADMISSION DATE:  06/11/2016 CONSULTATION DATE:  06/14/2016   REFERRING MD:  Dr. Allena Katz   CHIEF COMPLAINT:  Respiratory Failure   HISTORY OF PRESENT ILLNESS:   Sierra Hampton is a very pleasant 61 y.o. female with medical history significant of chronic respiratory failure related to asthma and COPD on home oxygen, obesity, hypertension, presents to emergency department with chief complaint of worsening shortness of breath for 3 days. Initial evaluation reveals acute on chronic respiratory failure likely related to COPD exacerbation and possible mild acute heart failure.  Patient has sleep apnea for which she is not compliant to CPAP therapy.  She was admitted to the floors and was treated as CHF diastolic heart failure exacerbation/pulmonary edema with diuresis. Patient also has chronic pain issues for which she was getting pain medicines when necessary. She was found to be lethargic on 4/8 with ABG of7.28/69/38 on RA. She was placed on BiPAP 18/6 40% and rpt ABG with 7.24/83/89. She improved on 4/9 ABG was 7.43/61/69 and was transitioned off Bipap to 4L O2. Patient transferred to SDU for closer monitoring and PCCM consult.   SUBJECTIVE:  No complaints from patient.  RN reports patient will quickly desat to 86% if off O2.  VITAL SIGNS: BP (!) 167/111 (BP Location: Right Wrist)   Pulse 80   Temp 99.1 F (37.3 C) (Oral)   Resp (!) 22   Ht  (1.727 m)   Wt (!) 431 lb (195.5 kg)   SpO2 91%   BMI 65.53 kg/m   HEMODYNAMICS:    VENTILATOR SETTINGS: Vent Mode: PCV;BIPAP FiO2 (%):  [30 %] 30 % Set Rate:  [14 bmp] 14 bmp  INTAKE / OUTPUT: I/O last 3 completed shifts: In: 500 [P.O.:240; IV Piggyback:260] Out: 9375 [Urine:9375]  PHYSICAL EXAMINATION: General:  Morbidly obese female sitting in the chair eating lunch HEENT: MM pink/moist Neuro: Awake, alert to person and place,  question effort with answering questions, refers most questions to saying "I wish my daughter was here to answer", MAE CV: Distant s1s2 rrr, no m/r/g appreciated PULM: even/non-labored, speaking full sentences, clear anteriorly, diminished in bases posteriorly JY:NWGN, non-tender, bsx4 active, obese Extremities: warm/dry, trace pitting pedal edema  Skin: no rashes or lesions   LABS:  BMET  Recent Labs Lab 06/14/16 0611 06/15/16 0315 06/16/16 0610  NA 142 145 141  K 3.9 3.0* 2.9*  CL 104 95* 87*  CO2 28 39* 42*  BUN 25* 16 16  CREATININE 1.03* 0.98 0.91  GLUCOSE 101* 99 98    Electrolytes  Recent Labs Lab 06/14/16 0611 06/15/16 0315 06/16/16 0610  CALCIUM 8.5* 8.4* 8.6*  MG  --  1.9  --     CBC  Recent Labs Lab 06/14/16 0611 06/15/16 0315 06/16/16 0610  WBC 12.6* 8.7 10.9*  HGB 11.4* 11.0* 12.0  HCT 38.1 36.3 39.1  PLT 203 189 213    Coag's  Recent Labs Lab 06/14/16 0611  INR 1.03    Sepsis Markers  Recent Labs Lab 06/14/16 0814 06/14/16 1105 06/14/16 1452 06/15/16 0315 06/16/16 0610  LATICACIDVEN 0.8 0.6  --   --   --   PROCALCITON  --   --  0.17 0.15 <0.10    ABG  Recent Labs Lab 06/14/16 1110 06/14/16 1505 06/15/16 0302  PHART 7.237* 7.291* 7.433  PCO2ART 82.9* 72.2* 61.1*  PO2ART 88.7 62.4* 68.8*  Liver Enzymes  Recent Labs Lab 06/11/16 0110 06/14/16 0611  AST 29 44*  ALT 30 68*  ALKPHOS 94 86  BILITOT 0.5 0.4  ALBUMIN 3.5 3.0*    Cardiac Enzymes  Recent Labs Lab 06/11/16 0724 06/11/16 1233 06/11/16 1840  TROPONINI 0.03* 0.03* 0.09*    Glucose  Recent Labs Lab 06/15/16 1600 06/15/16 2147 06/15/16 2309 06/16/16 0505 06/16/16 0839 06/16/16 1140  GLUCAP 111* 139* 127* 104* 102* 104*    Imaging No results found.   STUDIES:  2Decho 4/6  Hypokinesis, EF 55%  CULTURES: MRSA 4/8 >>  ANTIBIOTICS: none  SIGNIFICANT EVENTS: 4/5 admitted for acute on chronic dyspnea, treated with  diuresis 4/8 transfrred to ICU/SDU for resp depression/hypercapnea. Tried bipap 4/9 transitioned off bipap to [place]; bipap as needed  LINES/TUBES:   DISCUSSION: 34F, with OSA, likely severe, not on cpap, with asthma/COPD, on home o2, admitted with worsening SOB.  Treated as pulm edema.  Hypercapnea worsened by pain meds so she was tried on bipap and transferred to ICU/SDU.  Improved now and transitioned back to [place] with Bipap PRN with improvement in mental status.      ASSESSMENT / PLAN:  Acute on chronic hypoxemic hypercapnic respiratory failure secondary to unable to protect airway with sedating/pain meds + Untreated OSA + OHS + Pulm edema. No evidence for PNA at this point.  She endorses asthma/copd. Not in exacerbation.  Morbid Obesity  Plan: - Stable mental status today off Bipap since this 0400, still requiring above her home 2L O2 [place] - She is improved and questionably at her baseline respiratory status.   - Continue Bipap at HS and prn when sleeping - Continue O2 [place] to keep sats >88% - Continue Duonebs, albuterol prn and pulmicort - Continuing with lasix for diuresis - Taper prednisone >  x 2,  x 2,  x 2,  x 2, stop. - Minimize sedating medications - Continue pulmonary hygiene/mobilization with PT  - Will need outpatient follow-up with pulmonary on outpatient basis for PFT to eval for COPD/asthma  - Last sleep study 12/2015 with Dr. Maple Hudson w/recs for cpap. Plan for discharge for cpap auto titration 5-20 cmH20, mask of choice, heated/humidity.   - CSW following for discharge planning as she states her daughter lives with her and is her primary caregiver.  Daughter not currently at bedside.  Will need to ensure patient has cpap/O2 available to her when discharged. - SCDs and lovenox for VTE prophylaxis   Pulmonary will be available PRN.  Please call if problems arise.    Posey Boyer, AGACNP-BC [facility] Pgr: [phone] or if no answer  [phone] 06/16/2016, 12:31 PM   ATTENDING NOTE / ATTESTATION NOTE :   I have discussed the case with the resident/APP  Posey Boyer   I agree with the resident/APP's  history, physical examination, assessment, and plans.    I have edited the above note and modified it according to our agreed history, physical examination, assessment and plan.   Briefly, pt admitted with AoC hypoxemic hypercapneic resp failure 2/2 volume overload + untreated OSA + OHS + pulm edema.  Pt is also on home percocet.  She is clinically improved off bipap. VSS. NAD. Less crackles. Less edema. She wants to go home.   I mentioned to her that she will need a CPAP or Bipap titration study for her severe OSA when she gets discharged.  If she will go to a SNF or rehab place, pls try ordering  Bipap 14/6 cm water at HS (empiric settings)  and prn if possible.  Continue with diuretics.  Avoid pain meds.  Keep O2 sats > 88% Needs pulm f/u.   PCCM will sign off for now.  Call back if with issues.   Pollie Meyer, MD 06/17/2016, 9:39 PM [facility] Pager [phone] After 3 pm or if no answer, call [phone]

## 2016-06-16 NOTE — Clinical Social Work Note (Addendum)
Clinical Social Work Assessment  Patient Details  Name: Sierra Hampton MRN: [id number] Date of Birth: 1955-11-08  Date of referral:  06/16/16               Reason for consult:  Discharge Planning                Permission sought to share information with:  Family Supports Permission granted to share information::     Name::        Agency::     Relationship::  daughter Sierra Hampton [phone]   Contact Information:     Housing/Transportation Living arrangements for the past 2 months:  Single Family Home Source of Information:  Patient, Adult Children Patient Interpreter Needed:  None Criminal Activity/Legal Involvement Pertinent to Current Situation/Hospitalization:  No - Comment as needed Significant Relationships:  Adult Children, Other Family Members Lives with:  Adult Children (grandchildren) Do you feel safe going back to the place where you live?  Yes Need for family participation in patient care:  Yes (Comment) (daughter primary caretaker)  Care giving concerns: pt from home where she lives with adult daughter and grandchildren. Daughter primary caregiver and assists with pt's needs at home. Pt has been helping daughter with care for youngest grandchild while daughter at school 3days/week. Pt disabled and receives MCD. Pt has home health aid per daughter and uses O2 at home. Needs assessment for addt'l DME pt may benefit from in home.    Social Worker assessment / plan:  LCSW consulted for potential SNF placement. PT involved and recommends pt may benefit from skilled PT. Met with pt and family at bedside. Due to Medicaid funding and family situation (limited support from other adult children due to living out of area). Both uninterested in skilled facility placement at this time, prefer d/c home in care of daughter and home health. Agreeable to CM involvement to pursue addt'l home health resources. Plan: pt and family decline SNF, hopeful for d/c home with home  health.  Employment status:  Disabled (Comment on whether or not currently receiving Disability) Insurance information:    PT Recommendations:  Home, 24 Hour Supervision Information / Referral to community resources:     Patient/Family's Response to care:  Patient and family appreciative of support   Patient/Family's Understanding of and Emotional Response to Diagnosis, Current Treatment, and Prognosis:  Patient and family demonstrate adequate understanding of pt's treatment and plan. Daughter expressed appreciation for hospital staff support and is positive pt will receive care she needs following plan above.   Emotional Assessment Appearance:  Appears stated age Attitude/Demeanor/Rapport:   (appropriate) Affect (typically observed):  Accepting, Calm Orientation:  Oriented to Self, Oriented to Place, Oriented to  Time, Oriented to Situation Alcohol / Substance use:  Not Applicable Psych involvement (Current and /or in the community):  No (Comment)  Discharge Needs  Concerns to be addressed:  Discharge Planning Concerns Readmission within the last 30 days:  No Current discharge risk:  None Barriers to Discharge:  Continued Medical Work up   [employer], LCSW 06/16/2016, 11:51 AM

## 2016-06-16 NOTE — Progress Notes (Signed)
PROGRESS NOTE    Sierra Hampton  AVW:[id number] DOB: 27-Dec-1955 DOA: 06/11/2016 PCP: Lonia Blood, MD   Chief Complaint  Patient presents with  . Shortness of Breath    Brief Narrative:  HPI On 06/11/2016 by Ms. [name], NP Sierra Hampton is a very pleasant 61 y.o. female with medical history significant of chronic respiratory failure related to asthma and COPD on home oxygen, obesity, hypertension, presents to emergency department with chief complaint of worsening shortness of breath. Initial evaluation reveals acute on chronic respiratory failure likely related to COPD exacerbation and possible mild acute heart failure.  Information is obtained from the patient. She states she is on 2 L of oxygen at home at night she uses nebulizers and inhalers. 3 days ago she developed gradual worsening shortness of breath. She used her inhalers and nebulizers with no improvement. She states moving about makes it worse. She denies headache dizziness syncope or near-syncope. She denies chest pain palpitation cough lower extremity edema. She denies abdominal pain nausea vomiting diarrhea constipation melena. She denies dysuria hematuria frequency or urgency. She reports she stopped smoking in 2011.  Interim history Patient had worsening respiratory failure and PCCM consulted, and placed on BiPAP. Improving. PT consulted and recommended SNF.  Assessment & Plan   Acute on chronic hypoxic/hypercarbic respiratory failure -Presentation is multifactorial, primarily etiology appears acute on chronic diastolic dysfunction with underlying COPD and OSA. -Patient's baseline 2 L oxygen nasal cannula at night as needed.  -Chest x-ray with vascular interstitial prominence may represent mild congestive heart or no focal airspace consolidation no effusion.  -She has increased oxygen demand with ambulation. -Continue Steroids (transitioned to prednisone), nebulizer treatments, Lasix, flutter valve -Echocardiogram  with normal EF, diastolic dysfunction. -On 06/14/2016 ABG shows severe hypercarbia. PCCM consulted. Continue BiPAP at present.  -Worsening hypercarbia possibly secondary to chronic opioids which were discontinued -ABG showed improvement after BIPAP -PCCM feels patient may have chronic hypercarbia (CO2 in the 60s) and recommended outpatient PFTs -Continue BIPAP QHS, however patient not compliant  -Currently on nasal canula and O2 sats in the 90s.   COPD exacerbation -History of COPD and asthma -uses oxygen at 2 L at night as needed reportedly.  -chest x-ray as noted above -Continue treatment plan as above -Likely benefit from outpatient pulmonary function test  Acute on chronic diastolic heart failure -No history of heart failure, BNP elevated to 37  -Chest x-ray as noted above  -Continue IV Lasix, monitor intake and output and daily weights -Lisinopril  held due to acute kidney injury  -Echocardiogram normal EF, no wall motion abnormalities.  Acute kidney injury  -Baseline creatinine 2014 was approximately 0.9, currently 0.91 -Continue to monitor BMP  Elevated troponin -Likely demand, troponin peaked at 0.09 -Denies chest pain  Essential Hypertension -Continue amlodipine -Lisinopril, HCTZ held due to possible AKI -Will add on hydralazine   Morbid Obesity -BMI 70 -nutritional consult  Obstructive sleep apnea  -Review indicates had a sleep study in October 2017. Results noted obstructive sleep apnea and nocturnal hypoxemia. Note indicates moderate oxygen desaturation and EKG with PVCs, recommended CPAP. Patient noncompliant at home.  Physical deconditioning -PT consulted and recommended SNF -OT pending   DVT Prophylaxis  lovenox  Code Status: Full  Family Communication: None at bedside  Disposition Plan: Admitted. Continue to monitor in stepdown. Improvement in respiratory status as well as mental status.  Will need SNF at  discharge  Consultants PCCM  Procedures  Echocardiogram  Antibiotics   Anti-infectives  None      Subjective:   Sierra Hampton seen and examined today.  Currently on nasal canula and feeling better. Feels breathing has improved, but not at baseline. Would like more information on SNF. Denies chest, abdominal pain, N/V/C/D.   Objective:   Vitals:   06/16/16 0357 06/16/16 0728 06/16/16 1100 06/16/16 1202  BP:  (!) 141/88 (!) 167/111   Pulse: 66 76 80   Resp: 20 20 (!) 22   Temp:  99.2 F (37.3 C) 99.1 F (37.3 C)   TempSrc:  Oral Oral   SpO2: 93% 93% 90% 91%  Weight:      Height:        Intake/Output Summary (Last 24 hours) at 06/16/16 1302 Last data filed at 06/16/16 1000  Gross per 24 hour  Intake              200 ml  Output             4850 ml  Net            -4650 ml   Filed Weights   06/13/16 0648 06/14/16 0746 06/15/16 0432  Weight: (!) 210 kg (462 lb 15.5 oz) (!) 209.6 kg (462 lb) (!) 195.5 kg (431 lb)    Exam  General: Well developed, well nourished, NAD, appears stated age, Morbidly obese  HEENT: NCAT, membranes moist.   Neck: Supple, no masses. Unable to assess JVD due to body habitus  Cardiovascular: S1 S2 auscultated, RRR, no murmurs  Respiratory: Breath sounds diminished however no wheezing  Abdomen: Soft, Obese, nontender, nondistended, + bowel sounds  Extremities: warm dry without cyanosis clubbing  Neuro: AAOx3, nonfocal  Psych: Normal affect and demeanor, pleasant   Data Reviewed: I have personally reviewed following labs and imaging studies  CBC:  Recent Labs Lab 06/12/16 0926 06/13/16 0557 06/14/16 0611 06/15/16 0315 06/16/16 0610  WBC 13.2* 15.3* 12.6* 8.7 10.9*  HGB 12.2 11.4* 11.4* 11.0* 12.0  HCT 39.9 38.2 38.1 36.3 39.1  MCV 95.2 97.7 97.2 96.0 95.4  PLT 238 252 203 189 213   Basic Metabolic Panel:  Recent Labs Lab 06/12/16 0926 06/13/16 0557 06/14/16 0611 06/15/16 0315 06/16/16 0610  NA 141 142 142  145 141  K 4.0 3.7 3.9 3.0* 2.9*  CL 105 101 104 95* 87*  CO2 39* 42*  GLUCOSE 119* 104* 101* 99 98  BUN 19 25* 25* 16 16  CREATININE 1.05* 1.17* 1.03* 0.98 0.91  CALCIUM 9.0 8.7* 8.5* 8.4* 8.6*  MG  --   --   --  1.9  --    GFR: Estimated Creatinine Clearance: 123.9 mL/min (by C-G formula based on SCr of 0.91 mg/dL). Liver Function Tests:  Recent Labs Lab 06/11/16 0110 06/14/16 0611  AST 29 44*  ALT 30 68*  ALKPHOS 94 86  BILITOT 0.5 0.4  PROT 7.3 7.3  ALBUMIN 3.5 3.0*   No results for input(s): LIPASE, AMYLASE in the last 168 hours.  Recent Labs Lab 06/14/16 0814  AMMONIA 57*   Coagulation Profile:  Recent Labs Lab 06/14/16 0611  INR 1.03   Cardiac Enzymes:  Recent Labs Lab 06/11/16 0250 06/11/16 0724 06/11/16 1233 06/11/16 1840  TROPONINI 0.05* 0.03* 0.03* 0.09*   BNP (last 3 results) No results for input(s): PROBNP in the last 8760 hours. HbA1C: No results for input(s): HGBA1C in the last 72 hours. CBG:  Recent Labs Lab 06/15/16 2147 06/15/16 2309 06/16/16 0505 06/16/16 0839 06/16/16 1140  GLUCAP 139* 127* 104* 102* 104*   Lipid Profile: No results for input(s): CHOL, HDL, LDLCALC, TRIG, CHOLHDL, LDLDIRECT in the last 72 hours. Thyroid Function Tests:  Recent Labs  06/14/16 0814 06/14/16 1102  TSH 11.463*  --   FREET4  --  0.70   Anemia Panel:  Recent Labs  06/14/16 0814  VITAMINB12 564   Urine analysis:    Component Value Date/Time   COLORURINE COLORLESS (A) 06/11/2016 0810   APPEARANCEUR CLEAR 06/11/2016 0810   LABSPEC 1.004 (L) 06/11/2016 0810   PHURINE 6.0 06/11/2016 0810   GLUCOSEU NEGATIVE 06/11/2016 0810   HGBUR NEGATIVE 06/11/2016 0810   BILIRUBINUR NEGATIVE 06/11/2016 0810   KETONESUR NEGATIVE 06/11/2016 0810   PROTEINUR NEGATIVE 06/11/2016 0810   UROBILINOGEN 2.0 (H) 11/13/2012 1410   NITRITE NEGATIVE 06/11/2016 0810   LEUKOCYTESUR NEGATIVE 06/11/2016 0810   Sepsis  Labs: (procalcitonin:4,lacticidven:4)  ) Recent Results (from the past 240 hour(s))  MRSA PCR Screening     Status: None   Collection Time: 06/14/16  1:33 PM  Result Value Ref Range Status   MRSA by PCR NEGATIVE NEGATIVE Final    Comment:        The GeneXpert MRSA Assay (FDA approved for NASAL specimens only), is one component of a comprehensive MRSA colonization surveillance program. It is not intended to diagnose MRSA infection nor to guide or monitor treatment for MRSA infections.       Radiology Studies: No results found.   Scheduled Meds: . amLODipine  10 mg Oral Daily  . budesonide (PULMICORT) nebulizer solution  0.5 mg Nebulization BID  . chlorhexidine  15 mL Mouth Rinse BID  . enoxaparin (LOVENOX) injection  100 mg Subcutaneous Daily  . escitalopram  20 mg Oral Daily  . estrogens (conjugated)  0.625 mg Oral Daily   And  . medroxyPROGESTERone  2.5 mg Oral Daily  . furosemide  80 mg Intravenous Q8H  . insulin aspart  0-20 Units Subcutaneous Q4H  . ipratropium-albuterol  3 mL Nebulization QID  . mouth rinse  15 mL Mouth Rinse q12n4p  . pantoprazole  40 mg Oral Daily  . potassium chloride  40 mEq Oral BID  . predniSONE  50 mg Oral Q breakfast  . sodium chloride flush  10-40 mL Intracatheter Q12H  . sodium chloride flush  3 mL Intravenous Q12H  . sodium chloride flush  3 mL Intravenous Q12H   Continuous Infusions:   LOS: 5 days   Time Spent in minutes   35 minutes  Sierra Hampton D.O. on 06/15/2016 at 11:12 AM  Between 7am to 7pm - Pager - [phone]  After 7pm go to [email or web address] - password [email or web address]  And look for the night coverage person covering for me after hours  [facility] Office  [phone]

## 2016-06-17 ENCOUNTER — Inpatient Hospital Stay (HOSPITAL_COMMUNITY): Payer: Medicaid Other

## 2016-06-17 DIAGNOSIS — J9611 Chronic respiratory failure with hypoxia: Secondary | ICD-10-CM

## 2016-06-17 DIAGNOSIS — G4733 Obstructive sleep apnea (adult) (pediatric): Secondary | ICD-10-CM

## 2016-06-17 DIAGNOSIS — J9612 Chronic respiratory failure with hypercapnia: Secondary | ICD-10-CM

## 2016-06-17 LAB — GLUCOSE, CAPILLARY
GLUCOSE-CAPILLARY: 110 mg/dL — AB (ref 65–99)
GLUCOSE-CAPILLARY: 160 mg/dL — AB (ref 65–99)
GLUCOSE-CAPILLARY: 86 mg/dL (ref 65–99)
Glucose-Capillary: 98 mg/dL (ref 65–99)

## 2016-06-17 LAB — BASIC METABOLIC PANEL
ANION GAP: 12 (ref 5–15)
BUN: 22 mg/dL — ABNORMAL HIGH (ref 6–20)
CO2: 43 mmol/L — AB (ref 22–32)
Calcium: 9 mg/dL (ref 8.9–10.3)
Chloride: 85 mmol/L — ABNORMAL LOW (ref 101–111)
Creatinine, Ser: 0.99 mg/dL (ref 0.44–1.00)
GLUCOSE: 101 mg/dL — AB (ref 65–99)
POTASSIUM: 3 mmol/L — AB (ref 3.5–5.1)
Sodium: 140 mmol/L (ref 135–145)

## 2016-06-17 LAB — CBC
HCT: 39.4 % (ref 36.0–46.0)
Hemoglobin: 12.3 g/dL (ref 12.0–15.0)
MCH: 29.5 pg (ref 26.0–34.0)
MCHC: 31.2 g/dL (ref 30.0–36.0)
MCV: 94.5 fL (ref 78.0–100.0)
Platelets: 238 10*3/uL (ref 150–400)
RBC: 4.17 MIL/uL (ref 3.87–5.11)
RDW: 13.7 % (ref 11.5–15.5)
WBC: 13.5 10*3/uL — ABNORMAL HIGH (ref 4.0–10.5)

## 2016-06-17 MED ORDER — POTASSIUM CHLORIDE CRYS ER 20 MEQ PO TBCR
40.0000 meq | EXTENDED_RELEASE_TABLET | Freq: Four times a day (QID) | ORAL | Status: DC
  Filled 2016-06-17: qty 2

## 2016-06-17 MED ORDER — POTASSIUM CHLORIDE CRYS ER 20 MEQ PO TBCR
40.0000 meq | EXTENDED_RELEASE_TABLET | Freq: Four times a day (QID) | ORAL | Status: AC
  Administered 2016-06-17 (×2): 40 meq via ORAL
  Filled 2016-06-17 (×2): qty 2

## 2016-06-17 MED ORDER — POTASSIUM CHLORIDE CRYS ER 20 MEQ PO TBCR
40.0000 meq | EXTENDED_RELEASE_TABLET | Freq: Once | ORAL | Status: AC
  Administered 2016-06-17: 40 meq via ORAL
  Filled 2016-06-17: qty 2

## 2016-06-17 MED ORDER — ISOSORB DINITRATE-HYDRALAZINE 20-37.5 MG PO TABS
1.0000 | ORAL_TABLET | Freq: Three times a day (TID) | ORAL | Status: DC
  Administered 2016-06-17 – 2016-06-19 (×8): 1 via ORAL
  Filled 2016-06-17 (×9): qty 1

## 2016-06-17 MED ORDER — POTASSIUM CHLORIDE CRYS ER 20 MEQ PO TBCR
40.0000 meq | EXTENDED_RELEASE_TABLET | Freq: Every day | ORAL | Status: DC
  Administered 2016-06-17 – 2016-06-18 (×2): 40 meq via ORAL
  Filled 2016-06-17: qty 2

## 2016-06-17 MED ORDER — PREDNISONE 20 MG PO TABS
30.0000 mg | ORAL_TABLET | Freq: Every day | ORAL | Status: DC
  Administered 2016-06-18: 30 mg via ORAL
  Filled 2016-06-17: qty 1

## 2016-06-17 MED ORDER — MAGNESIUM SULFATE IN D5W 1-5 GM/100ML-% IV SOLN
1.0000 g | Freq: Once | INTRAVENOUS | Status: AC
  Administered 2016-06-17: 1 g via INTRAVENOUS
  Filled 2016-06-17: qty 100

## 2016-06-17 NOTE — Progress Notes (Signed)
PROGRESS NOTE    Sierra Hampton  ZOX:[id number] DOB: [birth year] DOA: 06/11/2016 PCP: Lonia Blood, MD   Chief Complaint  Patient presents with  . Shortness of Breath    Brief Narrative:  HPI On 06/11/2016 by Ms. [name], NP Sierra Hampton is a very pleasant 61 y.o. female with medical history significant of chronic respiratory failure related to asthma and COPD on home oxygen, obesity, hypertension, presents to emergency department with chief complaint of worsening shortness of breath. Initial evaluation reveals acute on chronic respiratory failure likely related to COPD exacerbation and possible mild acute heart failure.  Information is obtained from the patient. She states she is on 2 L of oxygen at home at night she uses nebulizers and inhalers. 3 days ago she developed gradual worsening shortness of breath. She used her inhalers and nebulizers with no improvement. She states moving about makes it worse. She denies headache dizziness syncope or near-syncope. She denies chest pain palpitation cough lower extremity edema. She denies abdominal pain nausea vomiting diarrhea constipation melena. She denies dysuria hematuria frequency or urgency. She reports she stopped smoking in 2011.  Interim history Patient had worsening respiratory failure and PCCM consulted, and placed on BiPAP. Improving. PT consulted and recommended SNF.  Assessment & Plan   Acute on chronic hypoxic/hypercarbic respiratory failure - due to combination of acute on chronic diastolic CHF with EF of 50-55% along with COPD exacerbation and underlying obstructive sleep apnea with CPAP noncompliance  She uses 2 L nasal canal oxygen at baseline, has been given home CPAP few months ago however patient claims that it was never properly delivered i.e. only part of the device was delivered, pulmonary critical care has been consulted and following. She is currently on oral steroids, daytime 40 to nasal cannula oxygen, IV Lasix.  Hypercarbia has resolved. Oxygenation is improved. She will be moved out of ICU on daily at bedtime BiPAP. Pulmonary also recommends outpatient PFTs.   COPD exacerbation -History of COPD and asthma, uses oxygen at 2 L at night as needed reportedly, chest x-ray as noted above. Pulmonary following and continue treatment as above.  Acute on chronic diastolic heart failure - EF 50% without any wall motion abnormality this admission, on IV Lasix with good diuresis, monitor renal function closely. Getting close to being compensated.  Acute kidney injury - likely due to CHF decompensation now resolved, ACE has been held this admission, creatinine close to baseline of 0.9. Monitor.  Elevated troponin - chest pain free, demand ischemia from CHF, troponin trend flat and in non-ACS pattern. No further workup needed. Echo showed preserved EF without any wall motion abnormalities.   Essential Hypertension  Continue amlodipine,  along with Lasix, added BiDil for better control.  Morbid Obesity  -BMI 70, nutritionist consulted follow with PCP post discharge for weight loss.   Obstructive sleep apnea  -Review indicates had a sleep study in October 2017. At which point she was recommended CPAP, question compliance but patient states that there was problem with CPAP delivery, case management requested to look into home CPAP/BiPAP situation per patient 3 months ago she had a sleep study after which part of the CPAP machine was delivered few parts are still missing, now requiring BiPAP while sleeping either in day or nighttime.  Physical deconditioning -PT consulted and required SNF upon discharge.    DVT Prophylaxis  lovenox  Code Status: Full  Family Communication: None at bedside  Disposition Plan: Likely discharge in 1-2 days, case management requested to  look into home CPAP situation per patient 3 months ago she had a sleep study after which part of the CPAP machine was delivered few parts are still  missing, also diuresis has to be completed and she has to be switched on oral diuretic, note she is now requiring BiPAP whenever she sleeps and this will be required at home as well.  Consultants PCCM  Procedures  Echocardiogram  Antibiotics   Anti-infectives    None      Subjective:   Sierra Hampton seen and examined today.  Comfortable and talking over the phone on 4 L nasal cannula oxygen, no headache chest or abdominal pain, currently no shortness of breath, did wear BiPAP last night.   Objective:   Vitals:   06/17/16 0411 06/17/16 0500 06/17/16 0808 06/17/16 0854  BP: (!) 162/105  (!) 167/91   Pulse: 68  70   Resp: 13  16   Temp: 98.9 F (37.2 C)  99 F (37.2 C)   TempSrc: Axillary  Oral   SpO2: 96%  96% 94%  Weight:  (!) 194.6 kg (429 lb)    Height:        Intake/Output Summary (Last 24 hours) at 06/17/16 0905 Last data filed at 06/17/16 1610  Gross per 24 hour  Intake             1795 ml  Output             3750 ml  Net            -1955 ml   Filed Weights   06/14/16 0746 06/15/16 0432 06/17/16 0500  Weight: (!) 209.6 kg (462 lb) (!) 195.5 kg (431 lb) (!) 194.6 kg (429 lb)    Exam  General: Morbidly obese African-American female in no distress,   HEENT: NCAT, membranes moist.   Neck: Supple, no masses. Unable to assess JVD due to body habitus  Cardiovascular: S1 S2 auscultated, RRR, no murmurs  Respiratory: Breath sounds diminished however no wheezing  Abdomen: Soft, Obese, nontender, nondistended, + bowel sounds  Extremities: warm dry without cyanosis clubbing  Neuro: AAOx3, nonfocal  Psych: Normal affect and demeanor, pleasant   Data Reviewed: I have personally reviewed following labs and imaging studies  CBC:  Recent Labs Lab 06/13/16 0557 06/14/16 0611 06/15/16 0315 06/16/16 0610 06/17/16 0424  WBC 15.3* 12.6* 8.7 10.9* 13.5*  HGB 11.4* 11.4* 11.0* 12.0 12.3  HCT 38.2 38.1 36.3 39.1 39.4  MCV 97.7 97.2 96.0 95.4 94.5  PLT 252  203 189 213 238   Basic Metabolic Panel:  Recent Labs Lab 06/13/16 0557 06/14/16 0611 06/15/16 0315 06/16/16 0610 06/17/16 0424  NA 142 142 145 141 140  K 3.7 3.9 3.0* 2.9* 3.0*  CL 101 104 95* 87* 85*  CO2 29 28 39* 42* 43*  GLUCOSE 104* 101* 99 98 101*  BUN 25* 25* 16 16 22*  CREATININE 1.17* 1.03* 0.98 0.91 0.99  CALCIUM 8.7* 8.5* 8.4* 8.6* 9.0  MG  --   --  1.9  --   --    GFR: Estimated Creatinine Clearance: 113.6 mL/min (by C-G formula based on SCr of 0.99 mg/dL). Liver Function Tests:  Recent Labs Lab 06/11/16 0110 06/14/16 0611  AST 29 44*  ALT 30 68*  ALKPHOS 94 86  BILITOT 0.5 0.4  PROT 7.3 7.3  ALBUMIN 3.5 3.0*   No results for input(s): LIPASE, AMYLASE in the last 168 hours.  Recent Labs Lab 06/14/16 0814  AMMONIA  57*   Coagulation Profile:  Recent Labs Lab 06/14/16 0611  INR 1.03   Cardiac Enzymes:  Recent Labs Lab 06/11/16 0250 06/11/16 0724 06/11/16 1233 06/11/16 1840  TROPONINI 0.05* 0.03* 0.03* 0.09*   BNP (last 3 results) No results for input(s): PROBNP in the last 8760 hours. HbA1C: No results for input(s): HGBA1C in the last 72 hours. CBG:  Recent Labs Lab 06/16/16 1140 06/16/16 1620 06/16/16 1956 06/16/16 2347 06/17/16 0408  GLUCAP 104* 157* 185* 127* 98   Lipid Profile: No results for input(s): CHOL, HDL, LDLCALC, TRIG, CHOLHDL, LDLDIRECT in the last 72 hours. Thyroid Function Tests:  Recent Labs  06/14/16 1102  FREET4 0.70   Anemia Panel: No results for input(s): VITAMINB12, FOLATE, FERRITIN, TIBC, IRON, RETICCTPCT in the last 72 hours. Urine analysis:    Component Value Date/Time   COLORURINE COLORLESS (A) 06/11/2016 0810   APPEARANCEUR CLEAR 06/11/2016 0810   LABSPEC 1.004 (L) 06/11/2016 0810   PHURINE 6.0 06/11/2016 0810   GLUCOSEU NEGATIVE 06/11/2016 0810   HGBUR NEGATIVE 06/11/2016 0810   BILIRUBINUR NEGATIVE 06/11/2016 0810   KETONESUR NEGATIVE 06/11/2016 0810   PROTEINUR NEGATIVE 06/11/2016  0810   UROBILINOGEN 2.0 (H) 11/13/2012 1410   NITRITE NEGATIVE 06/11/2016 0810   LEUKOCYTESUR NEGATIVE 06/11/2016 0810   Sepsis Labs: (procalcitonin:4,lacticidven:4)  ) Recent Results (from the past 240 hour(s))  MRSA PCR Screening     Status: None   Collection Time: 06/14/16  1:33 PM  Result Value Ref Range Status   MRSA by PCR NEGATIVE NEGATIVE Final    Comment:        The GeneXpert MRSA Assay (FDA approved for NASAL specimens only), is one component of a comprehensive MRSA colonization surveillance program. It is not intended to diagnose MRSA infection nor to guide or monitor treatment for MRSA infections.       Radiology Studies:  Dg Chest Port 1 View  Result Date: 06/17/2016 CLINICAL DATA:  Short of breath EXAM: PORTABLE CHEST 1 VIEW COMPARISON:  06/11/2016 FINDINGS: Cardiac enlargement. Resolution of pulmonary vascular congestion and bilateral airspace disease compatible with edema on the prior study. Currently the lungs are clear without edema or effusion. Negative for infiltrate Right arm PICC tip in the SVC. IMPRESSION: Interval resolution of pulmonary edema.  Lungs are now clear PICC tip in the SVC. Electronically Signed   By: Marlan Palau M.D.   On: 06/17/2016 08:07     Scheduled Meds: . amLODipine  10 mg Oral Daily  . budesonide (PULMICORT) nebulizer solution  0.5 mg Nebulization BID  . chlorhexidine  15 mL Mouth Rinse BID  . enoxaparin (LOVENOX) injection  100 mg Subcutaneous Daily  . escitalopram  20 mg Oral Daily  . estrogens (conjugated)  0.625 mg Oral Daily   And  . medroxyPROGESTERone  2.5 mg Oral Daily  . furosemide  80 mg Intravenous Q8H  . insulin aspart  0-20 Units Subcutaneous Q4H  . ipratropium-albuterol  3 mL Nebulization QID  . magnesium sulfate 1 - 4 g bolus IVPB  1 g Intravenous Once  . mouth rinse  15 mL Mouth Rinse q12n4p  . pantoprazole  40 mg Oral Daily  . [START ON 06/18/2016] potassium chloride  40 mEq Oral Daily  .  potassium chloride  40 mEq Oral Q6H  . predniSONE  40 mg Oral Q breakfast  . sodium chloride flush  10-40 mL Intracatheter Q12H  . sodium chloride flush  3 mL Intravenous Q12H  . sodium chloride flush  3  mL Intravenous Q12H   Continuous Infusions:   LOS: 6 days   Time Spent in minutes   35 minutes  Signature  Susa Raring M.D on 06/17/2016 at 9:05 AM  Between 7am to 7pm - Pager - [phone] ( page via [facility], text pages only, please mention full 10 digit call back number).   After 7pm go to [email or web address] - password [facility]

## 2016-06-17 NOTE — Progress Notes (Signed)
Pt. Stable. No c/o pain.  Transferred to 1O10 via bed without incident. Report given.

## 2016-06-17 NOTE — Progress Notes (Signed)
Physical Therapy Treatment Patient Details Name: Sierra Hampton MRN: [id number] DOB: 12/13/1955 Today's Date: 06/17/2016    History of Present Illness Sierra Demuro Williamsis a 61 y.o.femalewith medical history significant of chronic respiratory failure related to asthma and COPD on home oxygen, obesity, hypertension, presents to emergency department with chief complaint of worsening shortness of breath. Initial evaluation reveals acute on chronic respiratory failure likely related to COPD exacerbation and possible mild acute heart failure.PMH:  COPD, CHF, obesity,HTN, and OSA.     PT Comments    Pt making slow progress with mobility. She tolerated ambulating 10' with SPC and min A x2 for safety. Pt would greatly benefit from ST SNF prior to returning home; however, pt is currently refusing. No family/caregivers present to provide further education or safety training. PT will continue to follow acutely. All VSS throughout.     Follow Up Recommendations  Supervision/Assistance - 24 hour;SNF (pt currenlty refusing SNF, will need max HH services)     Equipment Recommendations  None recommended by PT;Other (comment) (pt has SPC and rollator at home)    Recommendations for Other Services       Precautions / Restrictions Precautions Precautions: Fall Precaution Comments: watch O2 Restrictions Weight Bearing Restrictions: No    Mobility  Bed Mobility Overal bed mobility: Needs Assistance Bed Mobility: Supine to Sit     Supine to sit: Supervision     General bed mobility comments: increased time, HOB flat, heavy use of bed rails and momentum  Transfers Overall transfer level: Needs assistance Equipment used: Straight cane Transfers: Sit to/from Stand Sit to Stand: Min assist;+2 safety/equipment         General transfer comment: increased time, use of momentum, min A x2 for safety and stability with transition  Ambulation/Gait Ambulation/Gait assistance: Min assist;+2  safety/equipment Ambulation Distance (Feet): 10 Feet Assistive device: Straight cane Gait Pattern/deviations: Step-through pattern;Decreased stride length;Wide base of support Gait velocity: decreased Gait velocity interpretation: Below normal speed for age/gender General Gait Details: mild instability but no LOB, min A x2 for safety and close chair follow   Stairs            Wheelchair Mobility    Modified Rankin (Stroke Patients Only)       Balance Overall balance assessment: Needs assistance Sitting-balance support: No upper extremity supported;Feet supported Sitting balance-Leahy Scale: Good     Standing balance support: During functional activity;Single extremity supported Standing balance-Leahy Scale: Poor Standing balance comment: pt reliant on SPC and one person HHA                            Cognition Arousal/Alertness: Awake/alert Behavior During Therapy: WFL for tasks assessed/performed Overall Cognitive Status: Within Functional Limits for tasks assessed                                        Exercises      General Comments        Pertinent Vitals/Pain Pain Assessment: No/denies pain    Home Living                      Prior Function            PT Goals (current goals can now be found in the care plan section) Acute Rehab PT Goals PT Goal Formulation: With patient Time For  Goal Achievement: 06/26/16 Potential to Achieve Goals: Good Progress towards PT goals: Progressing toward goals    Frequency    Min 3X/week      PT Plan Discharge plan needs to be updated    Co-evaluation             End of Session Equipment Utilized During Treatment: Oxygen;Gait belt Activity Tolerance: Patient limited by fatigue Patient left: in chair;with call bell/phone within reach Nurse Communication: Mobility status PT Visit Diagnosis: Muscle weakness (generalized) (M62.81)     Time: 1610-9604 PT Time  Calculation (min) (ACUTE ONLY): 22 min  Charges:  $Gait Training: 8-22 mins                    G Codes:       Hope, [place], DPT [phone]    Alessandra Bevels Amond Speranza 06/17/2016, 12:24 PM

## 2016-06-17 NOTE — Care Management Note (Addendum)
Case Management Note   Patient Details  Name: Sierra Hampton MRN: [id number] Date of Birth: 04/03/1955  Subjective/Objective:                  From home alone. /60 y.o. female with history of hypertension, COPD, and asthma who presents to the ED for evaluation of dyspnea. This patient states that she has been experiencing dyspnea over the last 3 days that has worsened and is now intolerable.  Action/Plan: Admit status INPATIENT (COPD exacerbation); anticipate discharge HOME WITH SELF CARE VS HOME HEALTH.  Addendum 4/6 09:50 Spoke with patient at the bedside. She states that she lives at home with her 51 year old daughter who provides her transportation to office visits. She uses [employer] and they deliver her medications to her. She states she has a HHA through Medicaid that provides 3-4 hours of care 2 days a week. She states that she gets her oxygen through [facility]. If patient requires oxygen for transport, please call [facility] at [phone]. Pta patient used O2 at night and PRN. Patient states she has a hospital bed, electric WC, shower seat at home.      Expected Discharge Date:   (unsure)               Expected Discharge Plan:  Home/Self Care  In-House Referral:  NA  Discharge planning Services  CM Consult  Post Acute Care Choice:    Choice offered to:     DME Arranged:    DME Agency:     HH Arranged:    HH Agency:     Status of Service:  In process, will continue to follow  If discussed at Long Length of Stay Meetings, dates discussed:    Additional Comments: 06/17/2016  Pt in the process of transferring to 3W -  Per consult  - CM requested BIPAP order via physician sticky notes and informed oncoming CM of newly ordered COPD protocol.  Per CSW  - pt and daughter are refusing SNF placement - would like to discharge home with [facility].  CM requested PT in house to assess her for discharging home - without the possibility of HHPT or OT due to hardship and lack of  insurance coverage.  CM also communicated this to pt ( daughter unreachable via phone) - pt continues to want to discharge home.  Daughter to provide 24 hour supervision as recommended.  Pt has CPAP in the home however today she informed CM that it is missing the actual CPAP "box".  Pt was unsure which company provided the equipment - stated her doctor ordered it.  Pt states she never contacted her doctor to inform of the missing equipment - pt states she will call her doctor today (from hospital) and inform him of the barrier to using the CPAP and request the companies information if the doctors office is not able to contact company directly - pt declined for CM to contact Doctor office directly.  CM will continue to follow for discharge needs  06/15/16 Pt is on home oxygen at night and as needed.  Pt states she weighs herself daily, not adherent to low salt diet - pt educated on the importance of low sodium diet  Pt extremely sleepy and CM was not able to complete assessment - CM unable to reach daughter via phone- CM will attempt to assess pt at a later time.  SNF recommended - CM consulted CSW Cherylann Parr, RN 06/17/2016, 10:20 AM

## 2016-06-18 ENCOUNTER — Inpatient Hospital Stay (HOSPITAL_COMMUNITY): Payer: Medicaid Other

## 2016-06-18 DIAGNOSIS — J9600 Acute respiratory failure, unspecified whether with hypoxia or hypercapnia: Secondary | ICD-10-CM

## 2016-06-18 LAB — BASIC METABOLIC PANEL
Anion gap: 9 (ref 5–15)
BUN: 29 mg/dL — AB (ref 6–20)
CO2: 38 mmol/L — ABNORMAL HIGH (ref 22–32)
CREATININE: 1.18 mg/dL — AB (ref 0.44–1.00)
Calcium: 9.1 mg/dL (ref 8.9–10.3)
Chloride: 90 mmol/L — ABNORMAL LOW (ref 101–111)
GFR, EST AFRICAN AMERICAN: 58 mL/min — AB (ref >60)
GFR, EST NON AFRICAN AMERICAN: 50 mL/min — AB (ref >60)
Glucose, Bld: 96 mg/dL (ref 65–99)
Potassium: 3.6 mmol/L (ref 3.5–5.1)
SODIUM: 137 mmol/L (ref 135–145)

## 2016-06-18 LAB — CBC
HCT: 39.8 % (ref 36.0–46.0)
Hemoglobin: 12.4 g/dL (ref 12.0–15.0)
MCH: 29.2 pg (ref 26.0–34.0)
MCHC: 31.2 g/dL (ref 30.0–36.0)
MCV: 93.9 fL (ref 78.0–100.0)
PLATELETS: 242 10*3/uL (ref 150–400)
RBC: 4.24 MIL/uL (ref 3.87–5.11)
RDW: 14 % (ref 11.5–15.5)
WBC: 14.3 10*3/uL — ABNORMAL HIGH (ref 4.0–10.5)

## 2016-06-18 LAB — GLUCOSE, CAPILLARY
Glucose-Capillary: 85 mg/dL (ref 65–99)
Glucose-Capillary: 100 mg/dL — ABNORMAL HIGH (ref 65–99)
Glucose-Capillary: 113 mg/dL — ABNORMAL HIGH (ref 65–99)
Glucose-Capillary: 141 mg/dL — ABNORMAL HIGH (ref 65–99)
Glucose-Capillary: 155 mg/dL — ABNORMAL HIGH (ref 65–99)

## 2016-06-18 LAB — MAGNESIUM: MAGNESIUM: 1.9 mg/dL (ref 1.7–2.4)

## 2016-06-18 MED ORDER — HYDROCHLOROTHIAZIDE 25 MG PO TABS
25.0000 mg | ORAL_TABLET | Freq: Every day | ORAL | Status: DC
  Administered 2016-06-18 – 2016-06-19 (×2): 25 mg via ORAL
  Filled 2016-06-18 (×2): qty 1

## 2016-06-18 MED ORDER — FUROSEMIDE 10 MG/ML IJ SOLN
40.0000 mg | Freq: Every day | INTRAMUSCULAR | Status: DC
  Administered 2016-06-18: 40 mg via INTRAVENOUS
  Filled 2016-06-18: qty 4

## 2016-06-18 MED ORDER — HYDROXYZINE HCL 25 MG PO TABS
25.0000 mg | ORAL_TABLET | Freq: Once | ORAL | Status: AC
  Administered 2016-06-18: 25 mg via ORAL
  Filled 2016-06-18: qty 1

## 2016-06-18 NOTE — Care Management Note (Addendum)
Case Management Note  Patient Details  Name: Sierra Hampton MRN: [id number] Date of Birth: 1955-05-05  Subjective/Objective:  Pt presented for Acute Respiratory Failure. Transfer to 3w on 06-17-16. CM did clarify with MD in regards to DME CPAP vs BIPAP. Order is for home CPAP. Per pt she is from home with daughter that can provide 24 hour supervision. Per pt she has an Aide via [place].                  Action/Plan: Pt has had a sleep study 10/17. Pt has 02 via [facility]. CM to fax orders for CPAP to [facility] at this time. CM did discuss HH Services. Pt is agreeable to [facility] for COPD Program, Aide and SW. CM did offer choice and pt chose [facility] for Services. SOC to begin within 24-48 hours post d/c. CM to assess if pt needs increased 02. Original order is for 02 qhs that [facility] has. Pt will need HHRN, SW, Aide orders. CM will continue to monitor.   Expected Discharge Date:   (unsure)               Expected Discharge Plan:  Home w Home Health Services  In-House Referral:  NA  Discharge planning Services  CM Consult  Post Acute Care Choice:  Home Health, Durable Medical Equipment Choice offered to:  Patient  DME Arranged:  Continuous positive airway pressure (CPAP) DME Agency:  [facility]  HH Arranged:  RN, Nurse's Aide, Social Work [employer] Agency:  [facility]  Status of Service:  Completed, signed off  If discussed at [employer], dates discussed:    Additional Comments: [id number] 06-19-16 Tomi Bamberger, RN,BSN [phone] Rollator ordered and pt to pick up at the [facility] on [facility]. Pt will be given order. Staff RN to ambulate patient to see if she qualifies for increased 02. Pt now only has 02 at home at night. If needs increased liter flow continuous- CM will fax orders to [facility]. [facility] to bring portable tank to hospital. No further needs from CM @ this time. Pt to be followed by [facility] once she gets home. No further needs from CM at this  time. Gala Lewandowsky, RN 06/18/2016, 2:35 PM

## 2016-06-18 NOTE — Progress Notes (Signed)
Pt stated that she was not ready to go on BIPAP right now and would call me later when she was ready. Checked back in on the pt at 0115 and she said she slept all day and was still wide awake. Pt was encouraged to call RT when she was ready to go to sleep.

## 2016-06-18 NOTE — Progress Notes (Signed)
PROGRESS NOTE    Sierra Hampton  ZOX:[id number] DOB: 1955/12/12 DOA: 06/11/2016 PCP: Lonia Blood, MD    Brief Narrative:  61 yo female, presented with acute on chronic respiratory failure. Has history of COPD, obesity hypoventilation syndrome and diastolic heart failure. Worsening symptoms for the last 3 days prior to hospitalization, refractive to bronchodilator therapy at home. On the initial evaluation, positive for tachypnea and increase oxygen requirements, associated with wheezing. Started on steroids and diuresis. Required non invasive mechanical ventilation.     Assessment & Plan:   Principal Problem:   Acute respiratory failure (HCC) Active Problems:   COPD exacerbation (HCC)   Obesity   Chronic respiratory failure (HCC)   Hypertension   Acute kidney injury (HCC)   Acute heart failure (HCC)   OSA (obstructive sleep apnea)   Elevated troponin   Obesity hypoventilation syndrome (HCC)   1. Acute on chronic hypoxic respiratory failure due to acute on chronic diastolic heart failure decompensation. Patient with improvement of her symptoms, will continue aggressive diuresis with furosemide, urine output over last 24 hours, 2,225 ml with a total negative fluis balance since admission -16,457 ml. Will continue bidil for blood pressure control and afterload reducing affect.   2. COPD exacerbation.  Will continue bronchodilator therapy, oxymetry monitoring, and supplemental 02 per [place], at home on 2 lpm flow. Continue inhaled corticosteroid with budesonide.   3. AKI. Will continue diuresis with furosemide, renal function with cr at 1.18 from 0.99, will follow on renal panel in am. K at 3,6 and Na at 137 with serum bicarbonate at 38. Will hold on k supplements for now.   4. HTN. Will continue blood pressure control with bidil, systolic 130 to 160, will add hctz for diuretic therapy.   5. OSA. Social services to resume cpap, auto titration 5 to 30 with humidity.   6. Morbid obesity.  Needs wight reduction, will continue to follow as outpatient.   7. Depression. Continue lexapro     DVT prophylaxis: enoxaparin  Code Status: full Family Communication: No family at the bedside  Disposition Plan: may need SNF.   Consultants:   Pulmonary   Procedures:   Antimicrobials:    Subjective: Patient reports improvement on dyspnea, no chest pain, no cough or wheezing. No nausea or vomiting. Using cpap. At home on 2 LPM, flow nasal cannula.   Objective: Vitals:   06/17/16 2100 06/18/16 0300 06/18/16 0757 06/18/16 0835  BP:  (!) 160/93  (!) 132/94  Pulse:      Resp:  13  (!) 23  Temp:  98.8 F (37.1 C)    TempSrc:      SpO2: 95% 100% 95%   Weight:  (!) 195 kg (430 lb)    Height:        Intake/Output Summary (Last 24 hours) at 06/18/16 0859 Last data filed at 06/18/16 0002  Gross per 24 hour  Intake              480 ml  Output             1900 ml  Net            -1420 ml   Filed Weights   06/15/16 0432 06/17/16 0500 06/18/16 0300  Weight: (!) 195.5 kg (431 lb) (!) 194.6 kg (429 lb) (!) 195 kg (430 lb)    Examination:  General exam: deconditioned E ENT: no pallor or icterus, oral mucosa moist.  Respiratory system: decreased ventilation, but no wheezing, rales or  rhonchi. Respiratory effort normal. Cardiovascular system: S1 & S2 heard, RRR. No JVD, murmurs, rubs, gallops or clicks. Non pitting pedal edema ++. Gastrointestinal system: Abdomen is nondistended, soft and nontender. No organomegaly or masses felt. Normal bowel sounds heard. Central nervous system: Alert and oriented. No focal neurological deficits. Extremities: Symmetric 5 x 5 power. Skin: No rashes, lesions or ulcers     Data Reviewed: I have personally reviewed following labs and imaging studies  CBC:  Recent Labs Lab 06/14/16 0611 06/15/16 0315 06/16/16 0610 06/17/16 0424 06/18/16 0520  WBC 12.6* 8.7 10.9* 13.5* 14.3*  HGB 11.4* 11.0* 12.0 12.3 12.4  HCT 38.1 36.3 39.1 39.4  39.8  MCV 97.2 96.0 95.4 94.5 93.9  PLT 203 189 213 238 242   Basic Metabolic Panel:  Recent Labs Lab 06/14/16 0611 06/15/16 0315 06/16/16 0610 06/17/16 0424 06/18/16 0520  NA 142 145 141 140 137  K 3.9 3.0* 2.9* 3.0* 3.6  CL 104 95* 87* 85* 90*  CO2 28 39* 42* 43* 38*  GLUCOSE 101* 99 98 101* 96  BUN 25* 16 16 22* 29*  CREATININE 1.03* 0.98 0.91 0.99 1.18*  CALCIUM 8.5* 8.4* 8.6* 9.0 9.1  MG  --  1.9  --   --  1.9   GFR: Estimated Creatinine Clearance: 95.4 mL/min (A) (by C-G formula based on SCr of 1.18 mg/dL (H)). Liver Function Tests:  Recent Labs Lab 06/14/16 0611  AST 44*  ALT 68*  ALKPHOS 86  BILITOT 0.4  PROT 7.3  ALBUMIN 3.0*   No results for input(s): LIPASE, AMYLASE in the last 168 hours.  Recent Labs Lab 06/14/16 0814  AMMONIA 57*   Coagulation Profile:  Recent Labs Lab 06/14/16 0611  INR 1.03   Cardiac Enzymes:  Recent Labs Lab 06/11/16 1233 06/11/16 1840  TROPONINI 0.03* 0.09*   BNP (last 3 results) No results for input(s): PROBNP in the last 8760 hours. HbA1C: No results for input(s): HGBA1C in the last 72 hours. CBG:  Recent Labs Lab 06/17/16 0806 06/17/16 1631 06/17/16 2358 06/18/16 0357 06/18/16 0807  GLUCAP 86 160* 110* 85 100*   Lipid Profile: No results for input(s): CHOL, HDL, LDLCALC, TRIG, CHOLHDL, LDLDIRECT in the last 72 hours. Thyroid Function Tests: No results for input(s): TSH, T4TOTAL, FREET4, T3FREE, THYROIDAB in the last 72 hours. Anemia Panel: No results for input(s): VITAMINB12, FOLATE, FERRITIN, TIBC, IRON, RETICCTPCT in the last 72 hours. Sepsis Labs:  Recent Labs Lab 06/14/16 0814 06/14/16 1105 06/14/16 1452 06/15/16 0315 06/16/16 0610  PROCALCITON  --   --  0.17 0.15 <0.10  LATICACIDVEN 0.8 0.6  --   --   --     Recent Results (from the past 240 hour(s))  MRSA PCR Screening     Status: None   Collection Time: 06/14/16  1:33 PM  Result Value Ref Range Status   MRSA by PCR NEGATIVE  NEGATIVE Final    Comment:        The GeneXpert MRSA Assay (FDA approved for NASAL specimens only), is one component of a comprehensive MRSA colonization surveillance program. It is not intended to diagnose MRSA infection nor to guide or monitor treatment for MRSA infections.          Radiology Studies: Dg Chest Port 1 View  Result Date: 06/17/2016 CLINICAL DATA:  Short of breath EXAM: PORTABLE CHEST 1 VIEW COMPARISON:  06/11/2016 FINDINGS: Cardiac enlargement. Resolution of pulmonary vascular congestion and bilateral airspace disease compatible with edema on the prior study.  Currently the lungs are clear without edema or effusion. Negative for infiltrate Right arm PICC tip in the SVC. IMPRESSION: Interval resolution of pulmonary edema.  Lungs are now clear PICC tip in the SVC. Electronically Signed   By: Marlan Palau M.D.   On: 06/17/2016 08:07        Scheduled Meds: . amLODipine  10 mg Oral Daily  . budesonide (PULMICORT) nebulizer solution  0.5 mg Nebulization BID  . chlorhexidine  15 mL Mouth Rinse BID  . enoxaparin (LOVENOX) injection  100 mg Subcutaneous Daily  . escitalopram  20 mg Oral Daily  . estrogens (conjugated)  0.625 mg Oral Daily   And  . medroxyPROGESTERone  2.5 mg Oral Daily  . furosemide  80 mg Intravenous Q8H  . insulin aspart  0-20 Units Subcutaneous Q4H  . ipratropium-albuterol  3 mL Nebulization QID  . isosorbide-hydrALAZINE  1 tablet Oral TID  . mouth rinse  15 mL Mouth Rinse q12n4p  . pantoprazole  40 mg Oral Daily  . potassium chloride  40 mEq Oral Daily  . predniSONE  30 mg Oral Q breakfast  . sodium chloride flush  10-40 mL Intracatheter Q12H   Continuous Infusions:   LOS: 7 days     Amyla Heffner Annett Gula, MD [facility] Pager [phone]  If 7PM-7AM, please contact night-coverage [email or web address] Password [email or web address] 06/18/2016, 8:59 AM

## 2016-06-18 NOTE — Progress Notes (Signed)
06/18/2016- Respiratory care note- Pt declined BIPAP at this time- Pt stated that she would call for BIPAP when ready to use.  BIPAP at bedside and available

## 2016-06-18 NOTE — Progress Notes (Signed)
Occupational Therapy Treatment Patient Details Name: Sierra Hampton MRN: [id number] DOB: 20-Oct-1955 Today's Date: 06/18/2016    History of present illness Sierra Hampton a 61 y.o.femalewith medical history significant of chronic respiratory failure related to asthma and COPD on home oxygen, obesity, hypertension, presents to emergency department with chief complaint of worsening shortness of breath. Initial evaluation reveals acute on chronic respiratory failure likely related to COPD exacerbation and possible mild acute heart failure.PMH:  COPD, CHF, obesity,HTN, and OSA.    OT comments  Pt requires min guard assist for toilet transfer with functional mobility in room today. Pt needs total assist for peri care in standing. Reviewed energy conservation strategies during ADL; SpO2 >90% on 2L throughout activity but pt quick to fatigue and requires seated rest breaks with mobility. D/c plan remains appropriate. Will continue to follow acutely.   Follow Up Recommendations  Home health OT;Supervision/Assistance - 24 hour;Other (comment) (HH aide)    Equipment Recommendations  None recommended by OT    Recommendations for Other Services PT consult    Precautions / Restrictions Precautions Precautions: Fall Precaution Comments: watch O2 Restrictions Weight Bearing Restrictions: No       Mobility Bed Mobility               General bed mobility comments: Pt OOB in chair upon arrival  Transfers Overall transfer level: Needs assistance Equipment used: Straight cane Transfers: Sit to/from Stand Sit to Stand: Min guard         General transfer comment: Min guard for safety. Pt needs increased time to perform sit to stand from chair x2, BSC x1.    Balance Overall balance assessment: Needs assistance Sitting-balance support: Feet supported;No upper extremity supported Sitting balance-Leahy Scale: Good     Standing balance support: Single extremity supported;During  functional activity Standing balance-Leahy Scale: Fair                             ADL either performed or assessed with clinical judgement   ADL Overall ADL's : Needs assistance/impaired     Grooming: Set up;Supervision/safety;Sitting                   Toilet Transfer: Min guard;Ambulation;BSC (cane)   Toileting- Clothing Manipulation and Hygiene: Total assistance;Sit to/from stand       Functional mobility during ADLs: Min guard;Cane General ADL Comments: Pt fatigues quickly with activity. Needs cues for pursed lip breathing and slowing down. SpO2 >90% on 2L throughout activity.     Vision       Perception     Praxis      Cognition Arousal/Alertness: Awake/alert Behavior During Therapy: WFL for tasks assessed/performed Overall Cognitive Status: Within Functional Limits for tasks assessed                                          Exercises     Shoulder Instructions       General Comments      Pertinent Vitals/ Pain       Pain Assessment: No/denies pain  Home Living                                          Prior Functioning/Environment  Frequency  Min 2X/week        Progress Toward Goals  OT Goals(current goals can now be found in the care plan section)  Progress towards OT goals: Progressing toward goals  Acute Rehab OT Goals Patient Stated Goal: get to the bathroom OT Goal Formulation: With patient  Plan Discharge plan remains appropriate    Co-evaluation                 End of Session Equipment Utilized During Treatment: Oxygen;Other (comment) (cane)  OT Visit Diagnosis: Unsteadiness on feet (R26.81);Other abnormalities of gait and mobility (R26.89)   Activity Tolerance Patient tolerated treatment well   Patient Left in chair;with call bell/phone within reach   Nurse Communication          Time: 1610-9604 OT Time Calculation (min): 23 min  Charges: OT  General Charges $OT Visit: 1 Procedure OT Treatments $Self Care/Home Management : 23-37 mins  Piercen Covino A. Brett Albino, M.S., OTR/L Pager: [phone]   Gaye Alken 06/18/2016, 3:19 PM

## 2016-06-19 ENCOUNTER — Inpatient Hospital Stay (HOSPITAL_COMMUNITY): Payer: Medicaid Other

## 2016-06-19 DIAGNOSIS — I5031 Acute diastolic (congestive) heart failure: Secondary | ICD-10-CM

## 2016-06-19 LAB — GLUCOSE, CAPILLARY
GLUCOSE-CAPILLARY: 131 mg/dL — AB (ref 65–99)
GLUCOSE-CAPILLARY: 98 mg/dL (ref 65–99)
Glucose-Capillary: 88 mg/dL (ref 65–99)
Glucose-Capillary: 85 mg/dL (ref 65–99)

## 2016-06-19 LAB — BASIC METABOLIC PANEL
ANION GAP: 8 (ref 5–15)
BUN: 27 mg/dL — ABNORMAL HIGH (ref 6–20)
CALCIUM: 8.8 mg/dL — AB (ref 8.9–10.3)
CO2: 36 mmol/L — AB (ref 22–32)
CREATININE: 1.15 mg/dL — AB (ref 0.44–1.00)
Chloride: 93 mmol/L — ABNORMAL LOW (ref 101–111)
GFR calc non Af Amer: 51 mL/min — ABNORMAL LOW (ref >60)
GFR, EST AFRICAN AMERICAN: 60 mL/min — AB (ref >60)
Glucose, Bld: 108 mg/dL — ABNORMAL HIGH (ref 65–99)
Potassium: 3.2 mmol/L — ABNORMAL LOW (ref 3.5–5.1)
SODIUM: 137 mmol/L (ref 135–145)

## 2016-06-19 LAB — CBC
HEMATOCRIT: 37.3 % (ref 36.0–46.0)
HEMOGLOBIN: 11.5 g/dL — AB (ref 12.0–15.0)
MCH: 29 pg (ref 26.0–34.0)
MCHC: 30.8 g/dL (ref 30.0–36.0)
MCV: 94 fL (ref 78.0–100.0)
Platelets: 211 10*3/uL (ref 150–400)
RBC: 3.97 MIL/uL (ref 3.87–5.11)
RDW: 14 % (ref 11.5–15.5)
WBC: 12.9 10*3/uL — AB (ref 4.0–10.5)

## 2016-06-19 MED ORDER — ISOSORB DINITRATE-HYDRALAZINE 20-37.5 MG PO TABS: 1.0000 | ORAL_TABLET | Freq: Three times a day (TID) | ORAL | 0 refills | Status: AC

## 2016-06-19 MED ORDER — ACETAMINOPHEN 325 MG PO TABS
650.0000 mg | ORAL_TABLET | Freq: Four times a day (QID) | ORAL | Status: DC | PRN
  Administered 2016-06-19: 650 mg via ORAL
  Filled 2016-06-19: qty 2

## 2016-06-19 MED ORDER — POTASSIUM CHLORIDE ER 10 MEQ PO TBCR: 10.0000 meq | EXTENDED_RELEASE_TABLET | Freq: Every day | ORAL | 0 refills | Status: AC

## 2016-06-19 MED ORDER — FUROSEMIDE 20 MG PO TABS: 20.0000 mg | ORAL_TABLET | Freq: Every day | ORAL | 0 refills | Status: AC

## 2016-06-19 MED ORDER — INSULIN ASPART 100 UNIT/ML ~~LOC~~ SOLN: 0.0000 [IU] | Freq: Three times a day (TID) | SUBCUTANEOUS | Status: DC

## 2016-06-19 MED ORDER — FUROSEMIDE 10 MG/ML IJ SOLN
40.0000 mg | Freq: Two times a day (BID) | INTRAMUSCULAR | Status: DC
  Administered 2016-06-19: 40 mg via INTRAVENOUS
  Filled 2016-06-19: qty 4

## 2016-06-19 NOTE — Discharge Summary (Addendum)
Physician Discharge Summary  Sierra Hampton WUJ:[id number] DOB: 1955-08-25 DOA: 06/11/2016  PCP: Lonia Blood, MD  Admit date: 06/11/2016 Discharge date: 06/19/2016  Admitted From:  Home  Disposition:  Home   Recommendations for Outpatient Follow-up:  1. Follow up with PCP in 1- weeks 2. Patient has been placed on BiDil to increased blood pressure control. 3. Furosemide has been started 20 g daily along with potassium supplements 4. Patient needs a close follow-up on kidney function and electrolytes.  Home Health: yes  Equipment/Devices: walker, bariatric  Discharge Condition: Stable  CODE STATUS: Full  Diet recommendation: Heart Healthy / Carb Modified    Brief/Interim Summary: 61 yo female who presented to the hospital with the chief complain of worsening dyspnea. 3 days of worsening symptoms, refractive to home bronchodilator use, worse with exertion. On the initial physical examination, her blood pressure 177/74, heart rate 87, respiratory rate 24, oxygen saturation 94% on supplemental oxygen. She had moderate to severe increased work of breathing, breath sounds were diminished bilaterally, heart S1-S2 present and rhythmic, abdomen protuberant, soft, nontender, lower extremities no edema. Sodium 140, potassium 3.8, chloride 104, bicarbonate 27, glucose 96, BUN 11, creatinine 1.0, BNP 237, white count 8.5, hemoglobin 11.9, hematocrit 38.7, platelets 231, urine analysis negative for infection. Chest x-ray with bilateral interstitial infiltrates, cardiomegaly, positive signs of air trapping, EKG sinus rhythm with left axis deviation, poor R-wave progression.  The patient was admitted to hospital with the working diagnosis of acute on chronic respiratory failure, hypoxic, related to COPD exacerbation combined with diastolic heart failure/cor pulmonale.   1. Acute on chronic hypoxic and hypercarbic respiratory failure. Patient's received diuresis with IV furosemide, systemic steroids and  empiric antibiotic therapy. Patient required noninvasive mechanical ventilation due to worsening hypercarbia.  Patient responded well to the medical therapy, by the time of discharge she was able to maintain oxygen saturation above 92% on 2 L per nasal cannula. Follow-up chest film show improvement of infiltrates, right base opacity, likely atelectasis, clinically no signs of infection, will need a close follow up as outpatient.    2. Diastolic heart failure exacerbation. Patient was diuresed with furosemide, with improvement of her symptoms. Negative fluid balance was achieved, -16,798 ml since admission. Will continue BiDil for afterload reducing agent. Patient will resume lisinopril 40 mg daily.    3. COPD exacerbation. Patient responded well to medical therapy, steroids were tapered off, continue bronchodilator therapy.  4. Acute kidney injury. Peak creatinine 1.18, discharge creatinine 1.15, will continue diuresis at home with furosemide and potassium supplements. Patient will need a close follow-up kidney function and electrolytes.   5. Hypertension. Continue isosorbide, hydralazine and hydrochlorothiazide, amlodipine and lisinopril   6. Obstructive sleep apnea. Patient was arranged to have home CPAP, auto titration 5-30 cm H20 with humidity. Follow-up as an outpatient with the pulmonary clinic.  6. Morbid obesity. Will need aggressive weight reduction as an outpatient.  7. Depression. Continue Lexapro.    Discharge Diagnoses:  Principal Problem:   Acute respiratory failure (HCC) Active Problems:   COPD exacerbation (HCC)   Obesity   Chronic respiratory failure (HCC)   Hypertension   Acute kidney injury (HCC)   Acute heart failure (HCC)   OSA (obstructive sleep apnea)   Elevated troponin   Obesity hypoventilation syndrome (HCC)    Discharge Instructions   Allergies as of 06/19/2016      Reactions   Penicillins Hives      Medication List    TAKE these medications    albuterol  108 (90 Base) MCG/ACT inhaler Commonly known as:  PROVENTIL HFA;VENTOLIN HFA Inhale 2 puffs into the lungs every 6 (six) hours as needed for wheezing.   albuterol (2.5 MG/3ML) 0.083% nebulizer solution Commonly known as:  PROVENTIL Take 2.5 mg by nebulization every 6 (six) hours as needed for wheezing or shortness of breath.   amLODipine 10 MG tablet Commonly known as:  NORVASC Take 10 mg by mouth daily.   escitalopram 20 MG tablet Commonly known as:  LEXAPRO Take 20 mg by mouth daily.   estrogen (conjugated)-medroxyprogesterone 0.625-2.5 MG tablet Commonly known as:  PREMPRO Take 1 tablet by mouth daily.   furosemide 20 MG tablet Commonly known as:  LASIX Take 1 tablet (20 mg total) by mouth daily.   hydrochlorothiazide 25 MG tablet Commonly known as:  HYDRODIURIL Take 25 mg by mouth daily.   isosorbide-hydrALAZINE 20-37.5 MG tablet Commonly known as:  BIDIL Take 1 tablet by mouth 3 (three) times daily.   lisinopril 40 MG tablet Commonly known as:  PRINIVIL,ZESTRIL Take 40 mg by mouth daily.   omeprazole 40 MG capsule Commonly known as:  PRILOSEC Take 40 mg by mouth 2 (two) times daily.   oxyCODONE-acetaminophen 10-325 MG tablet Commonly known as:  PERCOCET Take 1 tablet by mouth every 4 (four) hours as needed for pain.   potassium chloride 10 MEQ tablet Commonly known as:  K-DUR Take 1 tablet (10 mEq total) by mouth daily.            Durable Medical Equipment        Start     Ordered   06/19/16 1352  For home use only DME Walker rolling  Once    Comments:  Bariatric Rollator.  Question:  Patient needs a walker to treat with the following condition  Answer:  Weakness   06/19/16 1352   06/19/16 0000  For home use only DME oxygen    Question Answer Comment  Mode or (Route) Nasal cannula   Liters per Minute 2   Frequency Continuous (stationary and portable oxygen unit needed)   Oxygen conserving device Yes   Oxygen delivery system Gas       06/19/16 1513   06/18/16 1116  For home use only DME continuous positive airway pressure (CPAP)  Once    Comments:  Last sleep study 12/2015 with Dr. Maple Hudson w/recs for cpap. Plan for discharge for cpap auto titration 5-20 cmH20, mask of choice, heated/humidity.  Question Answer Comment  Patient has OSA or probable OSA Yes   Settings Autotitration   CPAP supplies needed Mask, headgear, cushions, filters, heated tubing and water chamber      06/18/16 1116   06/16/16 1414  For home use only DME continuous positive airway pressure (CPAP)  Once    Comments:  Autotitration 5-20 cm H20, mask of choice, humidity.  Sleep study 12/2015.  Question Answer Comment  Patient has OSA or probable OSA Yes   Is the patient currently using CPAP in the home No   Settings Other see comments   CPAP supplies needed Mask, headgear, cushions, filters, heated tubing and water chamber      06/16/16 1414     Follow-up Information    [facility] Follow up.   Why:  Registered Nurse, Aide and Child psychotherapist.  Contact information: [address] [place] [address] [phone]        [facility] Follow up.   Why:  CPAP for home use.  Contact information:  [address] [place] [address] [phone]        [facility] Follow up.   Why:  Arboriculturist- Can be picked up from the [facility] on [address].  Contact information: [address] [place] [address] [phone]          Allergies  Allergen Reactions  . Penicillins Hives    Consultations:  Cardiology   Procedures/Studies: Dg Chest 2 View  Result Date: 06/11/2016 CLINICAL DATA:  Dyspnea tonight. EXAM: CHEST  2 VIEW COMPARISON:  10/30/2013 FINDINGS: Unchanged moderate cardiomegaly. Moderate vascular and interstitial prominence. No confluent airspace consolidation. No effusion. IMPRESSION: Vascular and interstitial prominence may represent mild  congestive heart failure. No focal airspace consolidation. No effusion. Electronically Signed   By: Ellery Plunk M.D.   On: 06/11/2016 02:33   Ct Head Wo Contrast  Result Date: 06/14/2016 CLINICAL DATA:  Patient is sleepy and lethargic. Shortness of breath. EXAM: CT HEAD WITHOUT CONTRAST TECHNIQUE: Contiguous axial images were obtained from the base of the skull through the vertex without intravenous contrast. COMPARISON:  February 02, 2016 FINDINGS: The study is limited through the cerebellum and lower brain due to beam hardening and streak artifact. I spoke to the technologist who indicated these are the best possible images given patient's morbid obesity and condition. Brain: No subdural, epidural, or subarachnoid hemorrhage. The basal cisterns are patent. Cerebellum is not well evaluated but is grossly unremarkable. The brainstem is also poorly evaluated without obvious abnormality. No midline shift. No acute cortical ischemia or infarct. Vascular: No hyperdense vessel or unexpected calcification. Skull: Normal. Negative for fracture or focal lesion. Sinuses/Orbits: Mucosal thickening is seen in the ethmoid and sphenoid sinuses with no air-fluid levels. The mastoid air cells and middle ears are well aerated. Other: None. IMPRESSION: 1. Significantly limited study as above with no evidence of hemorrhage, ischemia, or infarct on provided images. Electronically Signed   By: Gerome Sam III M.D   On: 06/14/2016 08:02   Dg Chest Port 1 View  Result Date: 06/18/2016 CLINICAL DATA:  61 year old female with shortness of Breath. Admitted with multifactorial acute on chronic respiratory failure. EXAM: PORTABLE CHEST 1 VIEW COMPARISON:  06/17/2016 and earlier. FINDINGS: Portable AP semi upright view at 1018 hours. Stable cardiomegaly and mediastinal contours. Mildly increased pulmonary vascularity since yesterday but no overt edema. Mildly lower lung volumes. No pneumothorax. No pleural effusion or  consolidation identified. Visualized tracheal air column is within normal limits. IMPRESSION: Mildly lower lung volumes and increased pulmonary vascularity since yesterday but overall no acute cardiopulmonary abnormality. Stable cardiomegaly. Electronically Signed   By: Odessa Fleming M.D.   On: 06/18/2016 10:36   Dg Chest Port 1 View  Result Date: 06/17/2016 CLINICAL DATA:  Short of breath EXAM: PORTABLE CHEST 1 VIEW COMPARISON:  06/11/2016 FINDINGS: Cardiac enlargement. Resolution of pulmonary vascular congestion and bilateral airspace disease compatible with edema on the prior study. Currently the lungs are clear without edema or effusion. Negative for infiltrate Right arm PICC tip in the SVC. IMPRESSION: Interval resolution of pulmonary edema.  Lungs are now clear PICC tip in the SVC. Electronically Signed   By: Marlan Palau M.D.   On: 06/17/2016 08:07       Subjective: Patient feeling better, dyspnea has improved, no chest pain, no nausea or vomiting.   Discharge Exam: Vitals:   06/19/16 0318 06/19/16 0322  BP:  (!) 149/79  Pulse: 86  Resp: 16   Temp: 98.8 F (37.1 C)    Vitals:   06/19/16 0132 06/19/16 0318 06/19/16 0322 06/19/16 0740  BP:   (!) 149/79   Pulse: 78 86    Resp: (!) 24 16    Temp:  98.8 F (37.1 C)    TempSrc:  Oral    SpO2: 100% 96%  95%  Weight:  (!) 196.4 kg (433 lb)    Height:        General: Pt is alert, awake, not in acute distress Cardiovascular: RRR, S1/S2 +, no rubs, no gallops Respiratory: CTA bilaterally, no wheezing, no rhonchi, mild rales at bases Abdominal: Soft, NT, ND, bowel sounds + Extremities: non pitting edema, no cyanosis    The results of significant diagnostics from this hospitalization (including imaging, microbiology, ancillary and laboratory) are listed below for reference.     Microbiology: Recent Results (from the past 240 hour(s))  MRSA PCR Screening     Status: None   Collection Time: 06/14/16  1:33 PM  Result Value Ref  Range Status   MRSA by PCR NEGATIVE NEGATIVE Final    Comment:        The GeneXpert MRSA Assay (FDA approved for NASAL specimens only), is one component of a comprehensive MRSA colonization surveillance program. It is not intended to diagnose MRSA infection nor to guide or monitor treatment for MRSA infections.      Labs: BNP (last 3 results)  Recent Labs  06/11/16 0250  BNP 237.9*   Basic Metabolic Panel:  Recent Labs Lab 06/15/16 0315 06/16/16 0610 06/17/16 0424 06/18/16 0520 06/19/16 0600  NA 145 141 140 137 137  K 3.0* 2.9* 3.0* 3.6 3.2*  CL 95* 87* 85* 90* 93*  CO2 39* 42* 43* 38* 36*  GLUCOSE 99 98 101* 96 108*  BUN 16 16 22* 29* 27*  CREATININE 0.98 0.91 0.99 1.18* 1.15*  CALCIUM 8.4* 8.6* 9.0 9.1 8.8*  MG 1.9  --   --  1.9  --    Liver Function Tests:  Recent Labs Lab 06/14/16 0611  AST 44*  ALT 68*  ALKPHOS 86  BILITOT 0.4  PROT 7.3  ALBUMIN 3.0*   No results for input(s): LIPASE, AMYLASE in the last 168 hours.  Recent Labs Lab 06/14/16 0814  AMMONIA 57*   CBC:  Recent Labs Lab 06/15/16 0315 06/16/16 0610 06/17/16 0424 06/18/16 0520 06/19/16 0600  WBC 8.7 10.9* 13.5* 14.3* 12.9*  HGB 11.0* 12.0 12.3 12.4 11.5*  HCT 36.3 39.1 39.4 39.8 37.3  MCV 96.0 95.4 94.5 93.9 94.0  PLT 189 213 238 242 211   Cardiac Enzymes: No results for input(s): CKTOTAL, CKMB, CKMBINDEX, TROPONINI in the last 168 hours. BNP: Invalid input(s): POCBNP CBG:  Recent Labs Lab 06/18/16 1958 06/19/16 0022 06/19/16 0329 06/19/16 0733 06/19/16 1125  GLUCAP 155* 131* 88 85 98   D-Dimer No results for input(s): DDIMER in the last 72 hours. Hgb A1c No results for input(s): HGBA1C in the last 72 hours. Lipid Profile No results for input(s): CHOL, HDL, LDLCALC, TRIG, CHOLHDL, LDLDIRECT in the last 72 hours. Thyroid function studies No results for input(s): TSH, T4TOTAL, T3FREE, THYROIDAB in the last 72 hours.  Invalid input(s): FREET3 Anemia work  up No results for input(s): VITAMINB12, FOLATE, FERRITIN, TIBC, IRON, RETICCTPCT in the last 72 hours. Urinalysis    Component Value Date/Time   COLORURINE COLORLESS (A) 06/11/2016 0810   APPEARANCEUR CLEAR 06/11/2016 0810   LABSPEC 1.004 (L) 06/11/2016 1610  PHURINE 6.0 06/11/2016 0810   GLUCOSEU NEGATIVE 06/11/2016 0810   HGBUR NEGATIVE 06/11/2016 0810   BILIRUBINUR NEGATIVE 06/11/2016 0810   KETONESUR NEGATIVE 06/11/2016 0810   PROTEINUR NEGATIVE 06/11/2016 0810   UROBILINOGEN 2.0 (H) 11/13/2012 1410   NITRITE NEGATIVE 06/11/2016 0810   LEUKOCYTESUR NEGATIVE 06/11/2016 0810   Sepsis Labs Invalid input(s): PROCALCITONIN,  WBC,  LACTICIDVEN Microbiology Recent Results (from the past 240 hour(s))  MRSA PCR Screening     Status: None   Collection Time: 06/14/16  1:33 PM  Result Value Ref Range Status   MRSA by PCR NEGATIVE NEGATIVE Final    Comment:        The GeneXpert MRSA Assay (FDA approved for NASAL specimens only), is one component of a comprehensive MRSA colonization surveillance program. It is not intended to diagnose MRSA infection nor to guide or monitor treatment for MRSA infections.      Time coordinating discharge: 45 minutes  SIGNED:   Coralie Keens, MD  [facility] 06/19/2016, 2:39 PM Pager   If 7PM-7AM, please contact night-coverage [email or web address] Password [email or web address]

## 2016-06-19 NOTE — Progress Notes (Signed)
CSW received a call from pt's RN stating pt needs transportation.  CSW attempted to call family, there were no working phone numbers.  CSW updated pt's RN who stated [facility] will be called for taxi voucher.  Dorothe Pea. Deyonte Cadden, Theresia Majors, LCAS Clinical Social Worker Ph: [phone]

## 2016-06-19 NOTE — Progress Notes (Signed)
Physical Therapy Treatment Patient Details Name: Sierra Hampton MRN: [id number] DOB: September 17, 1955 Today's Date: 06/19/2016    History of Present Illness Sierra Stoneberg Williamsis a 61 y.o.femalewith medical history significant of chronic respiratory failure related to asthma and COPD on home oxygen, obesity, hypertension, presents to emergency department with chief complaint of worsening shortness of breath. Initial evaluation reveals acute on chronic respiratory failure likely related to COPD exacerbation and possible mild acute heart failure.PMH:  COPD, CHF, obesity,HTN, and OSA.     PT Comments    Patient progressing with mobility and endurance this session with use of rollator.  She remains appropriate for SNF, though wants to return home, so continue to recommend HHPT, aide, RN and SW.  PT to follow until d/c.   Follow Up Recommendations  SNF     Equipment Recommendations  None recommended by PT;Other (comment)    Recommendations for Other Services       Precautions / Restrictions Precautions Precautions: Fall Precaution Comments: watch O2    Mobility  Bed Mobility               General bed mobility comments: Pt OOB in chair upon arrival  Transfers Overall transfer level: Needs assistance Equipment used: Straight cane Transfers: Sit to/from Stand Sit to Stand: Supervision;Min guard         General transfer comment: assist for safety, momentum stragtegy used  Ambulation/Gait Ambulation/Gait assistance: Supervision Ambulation Distance (Feet): 70 Feet (x 2) Assistive device: Straight cane Gait Pattern/deviations: Step-through pattern;Decreased stride length;Wide base of support Gait velocity: decreased   General Gait Details: initially with cane in small space, then in open hallway with rollator, stopped and sat on rollator to rest due to fatigue, mild SOB on RA with SpO2 88%. Educated on safety with sit to stand from rollator, not to unlock until  standing   Stairs            Wheelchair Mobility    Modified Rankin (Stroke Patients Only)       Balance Overall balance assessment: Needs assistance Sitting-balance support: Feet supported;No upper extremity supported Sitting balance-Leahy Scale: Good     Standing balance support: Single extremity supported;During functional activity Standing balance-Leahy Scale: Fair Standing balance comment: can stand statically without UE support                            Cognition Arousal/Alertness: Awake/alert Behavior During Therapy: WFL for tasks assessed/performed Overall Cognitive Status: Within Functional Limits for tasks assessed                                        Exercises      General Comments General comments (skin integrity, edema, etc.): Patient with multiple complaints/needs, reports R flank pain esp when trying to clean herself after bowel movement and leaning back to clean perioneal area and under her pannus (RN informed); also reports unable to get around with oxygen tanks she has at home and that her rollator has ripped her pants on the sides.       Pertinent Vitals/Pain Faces Pain Scale: Hurts little more Pain Location: abdomen Pain Descriptors / Indicators: Sore Pain Intervention(s): Monitored during session    Home Living                      Prior Function  PT Goals (current goals can now be found in the care plan section) Progress towards PT goals: Progressing toward goals    Frequency    Min 3X/week      PT Plan Discharge plan needs to be updated    Co-evaluation             End of Session   Activity Tolerance: Patient tolerated treatment well Patient left: in chair;with call bell/phone within reach   PT Visit Diagnosis: Muscle weakness (generalized) (M62.81)     Time: 2130-8657 PT Time Calculation (min) (ACUTE ONLY): 43 min  Charges:  $Gait Training: 8-22  mins $Therapeutic Activity: 23-37 mins                    G CodesSheran Hampton, [place] [phone] 06/19/2016    Sierra Hampton 06/19/2016, 3:44 PM

## 2016-06-19 NOTE — Progress Notes (Signed)
SATURATION QUALIFICATIONS: (This note is used to comply with regulatory documentation for home oxygen)  Patient Saturations on Room Air at Rest = 87%  Patient Saturations on Room Air while Ambulating = N/A  Patient Saturations on 2 Liters of oxygen while sitting in chair = 94%  Please briefly explain why patient needs home oxygen:

## 2016-06-19 NOTE — Progress Notes (Signed)
OT Note  Pt has been provided with additional AE kit. Per RN; pt moved units a few times and initial AE kit provided to pt was lost in move. RN called previous unit and they were unable to locate.   Truman Hayward M.S., OTR/L Pager: [phone]

## 2016-09-02 ENCOUNTER — Emergency Department (HOSPITAL_COMMUNITY)
Admission: EM | Admit: 2016-09-02 | Discharge: 2016-09-06 | Disposition: E | Payer: Medicaid Other | Attending: Emergency Medicine | Admitting: Emergency Medicine

## 2016-09-02 ENCOUNTER — Encounter (HOSPITAL_COMMUNITY): Payer: Self-pay | Admitting: Emergency Medicine

## 2016-09-02 DIAGNOSIS — I110 Hypertensive heart disease with heart failure: Secondary | ICD-10-CM | POA: Insufficient documentation

## 2016-09-02 DIAGNOSIS — J45909 Unspecified asthma, uncomplicated: Secondary | ICD-10-CM | POA: Insufficient documentation

## 2016-09-02 DIAGNOSIS — Z79899 Other long term (current) drug therapy: Secondary | ICD-10-CM | POA: Diagnosis not present

## 2016-09-02 DIAGNOSIS — Z87891 Personal history of nicotine dependence: Secondary | ICD-10-CM | POA: Insufficient documentation

## 2016-09-02 DIAGNOSIS — I509 Heart failure, unspecified: Secondary | ICD-10-CM | POA: Diagnosis not present

## 2016-09-02 DIAGNOSIS — I469 Cardiac arrest, cause unspecified: Secondary | ICD-10-CM | POA: Insufficient documentation

## 2016-09-02 MED ORDER — SODIUM BICARBONATE 8.4 % IV SOLN
INTRAVENOUS | Status: AC | PRN
  Administered 2016-09-02: 50 meq via INTRAVENOUS

## 2016-09-02 MED ORDER — EPINEPHRINE PF 1 MG/10ML IJ SOSY
PREFILLED_SYRINGE | INTRAMUSCULAR | Status: AC | PRN
  Administered 2016-09-02 (×2): 1 mg via INTRAVENOUS

## 2016-09-02 NOTE — ED Notes (Addendum)
[name] notified to MotorolaDionne Hampton, Ref.# [id number] .

## 2016-09-02 NOTE — ED Triage Notes (Addendum)
Patient arrived with EMS from home found inside the bathroom unresponsive , CPR initiated by firemen , EMS gave 6 doses of Epinephrine using intraosseous access at right lower leg , Amiodarone  300 mg , shock x3 for VFib prior to arrival. CPR in progress at arrival .

## 2016-09-02 NOTE — Code Documentation (Signed)
Pulse check : Asystole - chest compressions continues.

## 2016-09-02 NOTE — Code Documentation (Signed)
Chest compressions continues. 

## 2016-09-02 NOTE — Code Documentation (Signed)
Pulse check : asystole / cardiac ultrasound with no cardiac activity . Dr. Melene Planan Floyd pronounced pt.dead .

## 2016-09-02 NOTE — Progress Notes (Signed)
RT NOTE:  Pt arrived by EMS. King airway placed by EMS. RT took over manual ventilation throughout CPR. MD calls code @ 2146. King airway not removed.

## 2016-09-02 NOTE — ED Notes (Signed)
Family at bedside , RN/Chaplain with family for support.

## 2016-09-02 NOTE — ED Notes (Signed)
Dr. Edward Jolly. Floyd advised nuse that pt. will not be a ME case and Dr. Mikeal HawthorneGarba ( PCP ) will sign the death certificate .

## 2016-09-02 NOTE — ED Notes (Signed)
Bed placement notified on pt.'s death , post mortem care rendered , pt.'s family at bedside .

## 2016-09-06 NOTE — ED Notes (Signed)
Pt. Transported to morgue.

## 2016-09-06 NOTE — Progress Notes (Signed)
Chaplain paged to ED for Cardiac Arrest.  Family already escorted to consult room.  Patient passed.  Family very distraught and had to stagger taking them back to room.  Got information of next of kin.  Daughter is Sierra Hampton, [phone].  Other family that was present, Sierra Hampton, [phone], [address].  Also gave information for daughters, Sierra Hampton [phone] and Sierra Hampton [phone].     1217 05/21/16 Hillery JacksLisa Keny Donald

## 2016-09-06 NOTE — ED Provider Notes (Signed)
[facility]-EMERGENCY DEPT Provider Note   CSN: [id number] Arrival date & time: 08/09/2016  2134     History   Chief Complaint Chief Complaint  Patient presents with  . Cardiac Arrest    HPI Sierra Hampton is a 61 y.o. female.  The history is provided by the EMS personnel. The history is limited by the condition of the patient.  Cardiac Arrest  Witnessed by:  Family member Incident location:  Home Time since incident: 1 hour. Condition upon EMS arrival:  Unresponsive (Per EMS, pt's family heard a fall in the bathroom.  Took EMS approx 10 mins before arrival. On arrival, pt was stuck in the small bathroom. Took approx 15 -20 mins to get the patient out of the restroom.) Pulse:  Absent Initial cardiac rhythm per EMS:  PEA Airway:  Bag valve mask Rhythm on admission to ED: Vfib x 3 with defibrillation x 3.  ROSC once with ETCO 60s. Epi x 6 and amiodarone 300 mg.  CPR for 40 mins prior to arrival. PEA on arrival.      Past Medical History:  Diagnosis Date  . Asthma   . COPD (chronic obstructive pulmonary disease) (HCC)   . Dyspnea   . Hypertension   . Nocturnal hypoxemia   . Obstructive sleep apnea   . Ventral hernia     Patient Active Problem List   Diagnosis Date Noted  . Acute respiratory failure (HCC) 06/11/2016  . COPD exacerbation (HCC) 06/11/2016  . Obesity 06/11/2016  . Chronic respiratory failure (HCC) 06/11/2016  . Acute kidney injury (HCC) 06/11/2016  . Acute heart failure (HCC) 06/11/2016  . Elevated troponin 06/11/2016  . Hypertension   . OSA (obstructive sleep apnea)   . Nocturnal hypoxemia   . Obesity hypoventilation syndrome [facility])     Past Surgical History:  Procedure Laterality Date  . ABDOMINAL HYSTERECTOMY    . UTERINE FIBROID SURGERY      OB History    No data available       Home Medications    Prior to Admission medications   Medication Sig Start Date End Date Taking? Authorizing Provider  albuterol (PROVENTIL HFA;VENTOLIN HFA) 108  (90 BASE) MCG/ACT inhaler Inhale 2 puffs into the lungs every 6 (six) hours as needed for wheezing. 04/28/12   Santiago Glad, PA-C  albuterol (PROVENTIL) (2.5 MG/3ML) 0.083% nebulizer solution Take 2.5 mg by nebulization every 6 (six) hours as needed for wheezing or shortness of breath.    [provider]  amLODipine (NORVASC) 10 MG tablet Take 10 mg by mouth daily.    [provider]  escitalopram (LEXAPRO) 20 MG tablet Take 20 mg by mouth daily.    [provider]  estrogen, conjugated,-medroxyprogesterone (PREMPRO) 0.625-2.5 MG per tablet Take 1 tablet by mouth daily.    [provider]  furosemide (LASIX) 20 MG tablet Take 1 tablet (20 mg total) by mouth daily. 06/19/16   Arrien, York Ram, MD  hydrochlorothiazide (HYDRODIURIL) 25 MG tablet Take 25 mg by mouth daily.    [provider]  isosorbide-hydrALAZINE (BIDIL) 20-37.5 MG tablet Take 1 tablet by mouth 3 (three) times daily. 06/19/16   Arrien, York Ram, MD  lisinopril (PRINIVIL,ZESTRIL) 40 MG tablet Take 40 mg by mouth daily.    [provider]  omeprazole (PRILOSEC) 40 MG capsule Take 40 mg by mouth 2 (two) times daily.     [provider]  oxyCODONE-acetaminophen (PERCOCET) 10-325 MG per tablet Take 1 tablet by mouth every 4 (  four) hours as needed for pain. 11/30/14   Lorre NickAllen, Anthony, MD  potassium chloride (K-DUR) 10 MEQ tablet Take 1 tablet (10 mEq total) by mouth daily. 06/19/16   Arrien, York RamMauricio Daniel, MD    Family History Family History  Problem Relation Age of Onset  . CAD Mother   . Diabetes Father     Social History Social History  Substance Use Topics  . Smoking status: Former Games developermoker  . Smokeless tobacco: Never Used     Comment: quit 2008  . Alcohol use No     Allergies   Penicillins   Review of Systems Review of Systems  Unable to perform ROS: Intubated     Physical Exam Updated Vital Signs Wt (!) 181.4 kg (400 lb) Comment:  approx. 400 lbs.   BMI 60.82 kg/m   Physical Exam  Constitutional:  Morbidly obese  HENT:  Head: Atraumatic.  Eyes:  Dilated and fixed bilaterally  Neck: No tracheal deviation present.  Cardiovascular:  No peripheral pulses  Pulmonary/Chest:  King airway in place, breath sounds equal bilaterally.  Abdominal: She exhibits no distension.  Musculoskeletal: She exhibits no edema.  Neurological:  GCS 3T  Skin:  Cool     ED Treatments / Results  Labs (all labs ordered are listed, but only abnormal results are displayed) Labs Reviewed - No data to display  EKG  EKG Interpretation None       Radiology No results found.  Procedures Procedures (including critical care time)  Medications Ordered in ED Medications  EPINEPHrine (ADRENALIN) 1 MG/10ML injection (1 mg Intravenous Given 2017/01/12 2142)  sodium bicarbonate injection (50 mEq Intravenous Given 2017/01/12 2143)     Initial Impression / Assessment and Plan / ED Course  I have reviewed the triage vital signs and the nursing notes.  Pertinent labs & imaging results that were available during my care of the patient were reviewed by me and considered in my medical decision making (see chart for details).     61 year old female who presents to the emergency Department following cardiac arrest. Patient's family heard a fall in the bathroom. Approximately 10 minutes of no CPR prior to EMS arrival. Given patient's morbid obesity and a small bathroom, unable to to remove the patient from the bathroom in order to do CPR for approximately 15 minutes. Initial rhythm PEA, pulseless on arrival. CPR for approximately 40 minutes. Given epi, defibrillated x 3, ROSC briefly with immediate PEA. King airway in place.   IV access was obtained. CPR was continued in the emergency department. Patient remained in PEA. Given 3 rounds of CPR with epi, bicarbonate. Bedside cardiac ultrasound showed no pericardial effusion, no heart activity, no  signs of right heart strain. Patient with prolonged downtime and prolonged CPR with fixed dilated pupils.  Ultimately, patient would not have a significant neurologic outcome.  ETCO2 16. Time of death, 21:46.  Pt's family members were updated, not an ME case.  Pt's family members at bedside.   Final Clinical Impressions(s) / ED Diagnoses   Final diagnoses:  Cardiac arrest [facility](HCC)    New Prescriptions Discharge Medication List as of 08/14/2016 12:27 AM       Corena HerterMumma, Alvey Brockel, MD 08/17/2016 0029    Melene PlanFloyd, Dan, DO 08/23/2016 1658

## 2016-09-06 DEATH — deceased

## 2018-01-20 IMAGING — CT CT HEAD W/O CM
3 of 5 series · 15 of 47 positions shown, 18 images · non-contrast
Comparison: February 02, 2016

CLINICAL DATA: Patient is sleepy and lethargic. Shortness of
breath.

EXAM:
CT HEAD WITHOUT CONTRAST
TECHNIQUE: Contiguous axial images were obtained from the base of the skull
through the vertex without intravenous contrast.

[Series 5: head without cor · coronal · non-contrast · 0.33mm/px · 3 of 67 slices shown]
[im 23/67  brain]
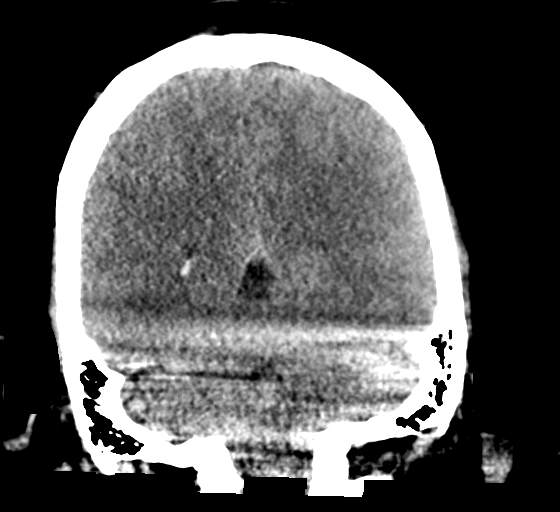
[im 30/67  brain]
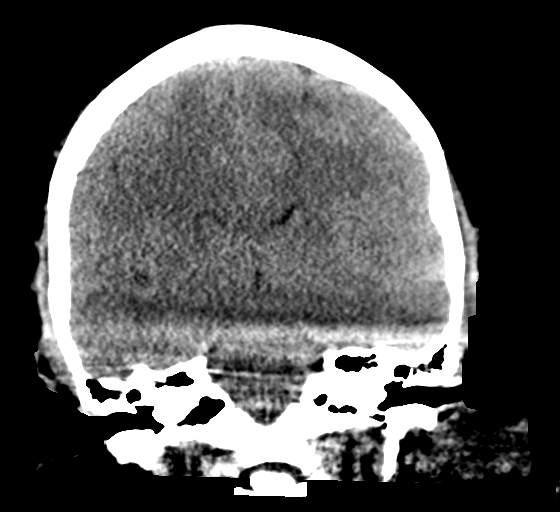
[im 37/67  brain]
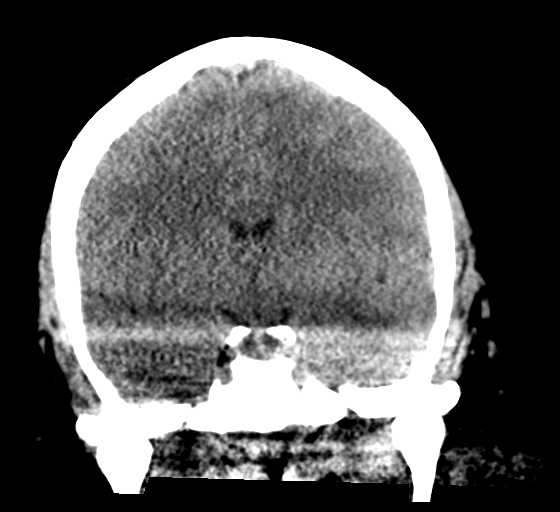

[Series 6: head without sag · sagittal · non-contrast · 0.33mm/px · 3 of 66 slices shown]
[im 22/66  brain]
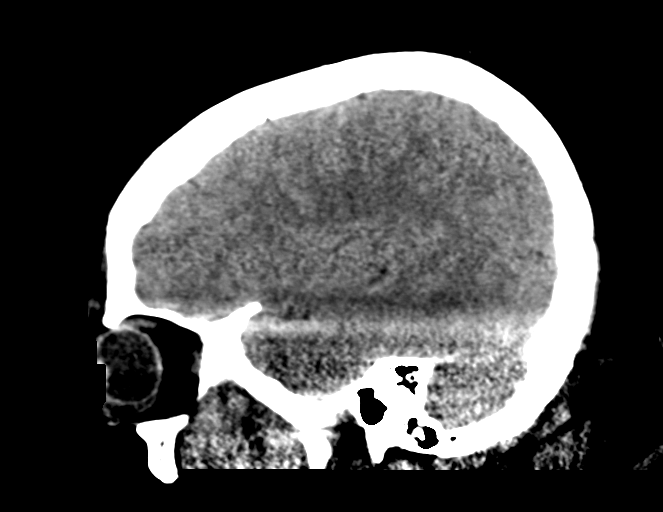
[im 33/66  brain]
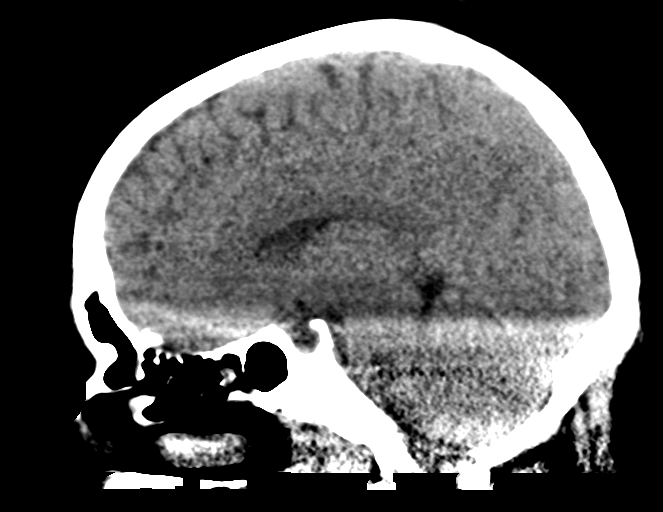
[im 44/66  brain]
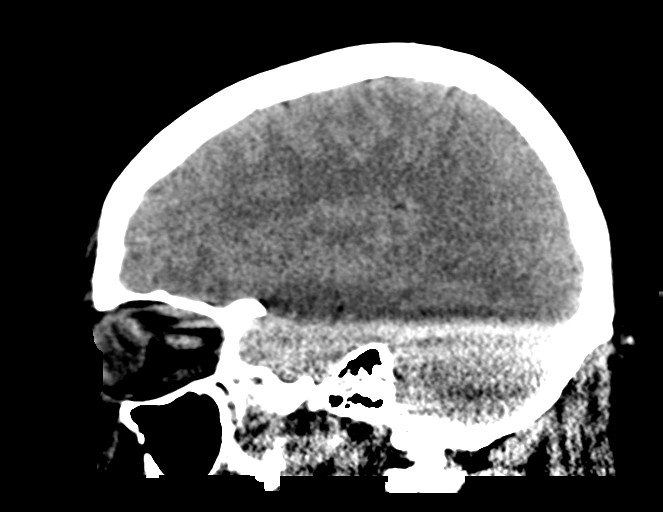

[Series 8: head without · axial · non-contrast · 0.42mm/px · z∈[-720,-571]mm · 9 of 169 slices shown, 12 images]
[im 10/169  brain]
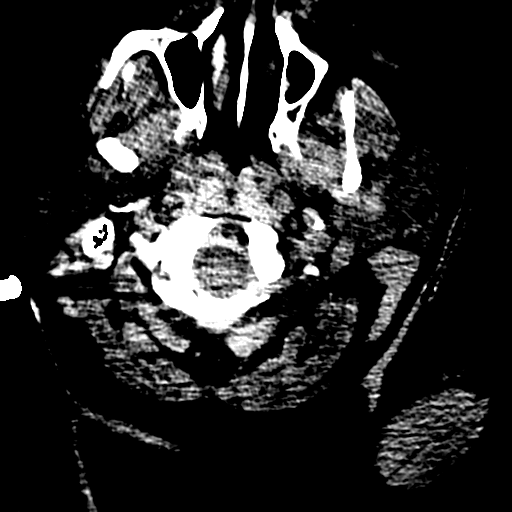
[im 10/169  bone]
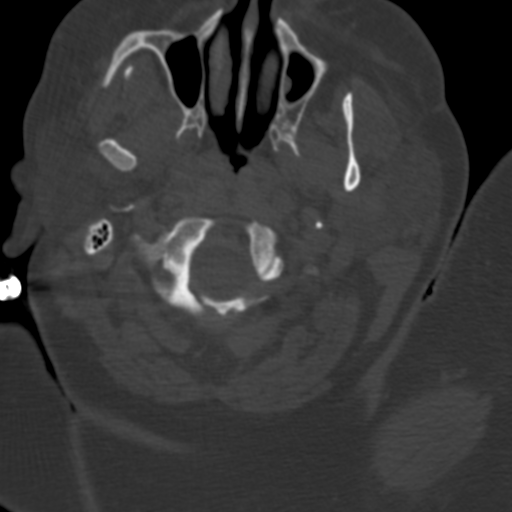
[im 30/169  brain]
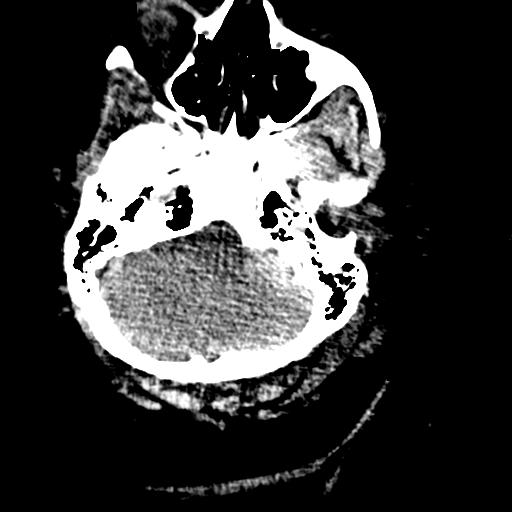
[im 50/169  brain]
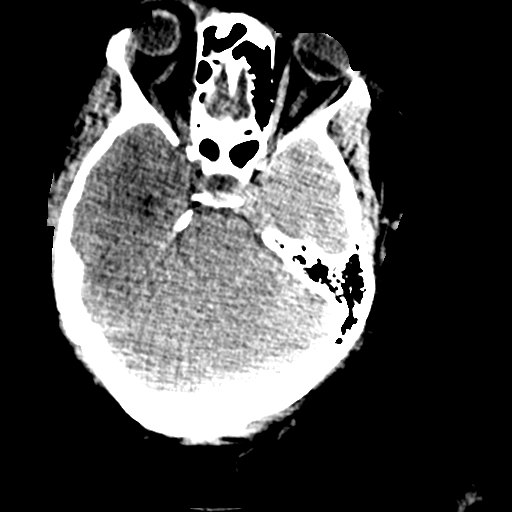
[im 70/169  brain]
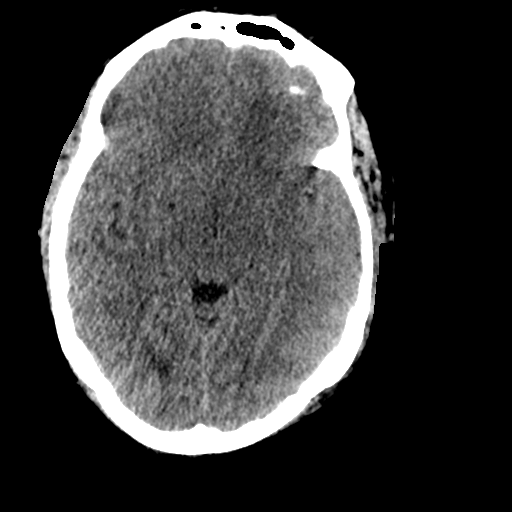
[im 89/169  brain]
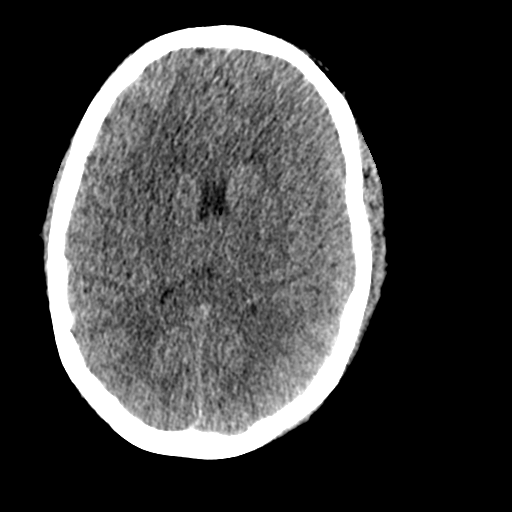
[im 89/169  bone]
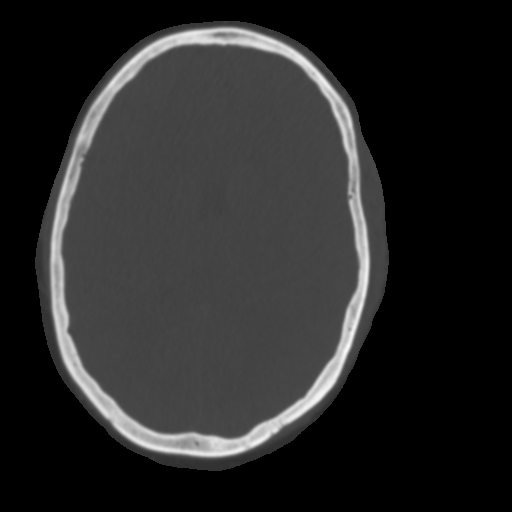
[im 99/169  brain]
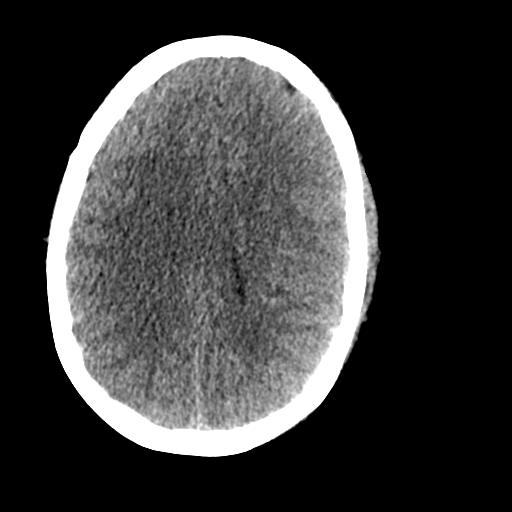
[im 119/169  brain]
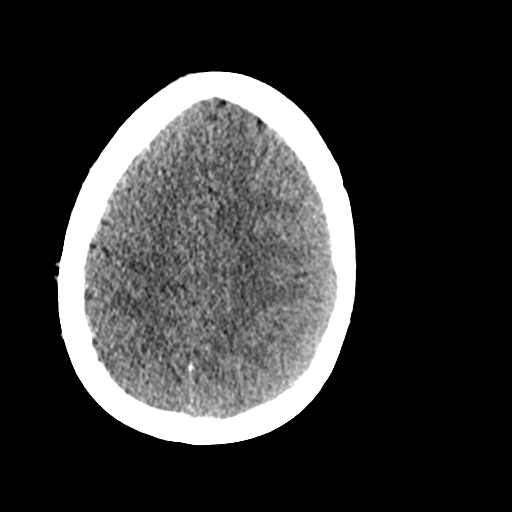
[im 139/169  brain]
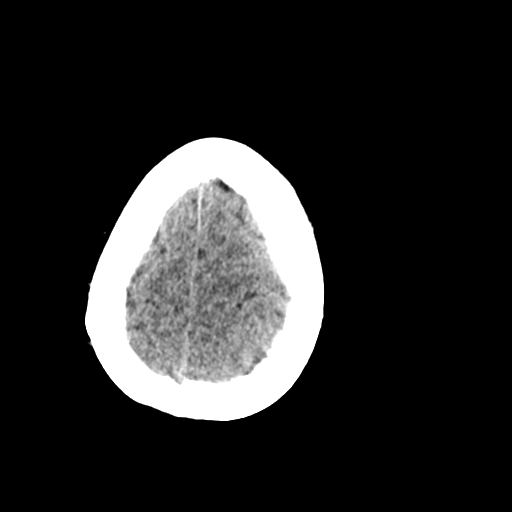
[im 159/169  brain]
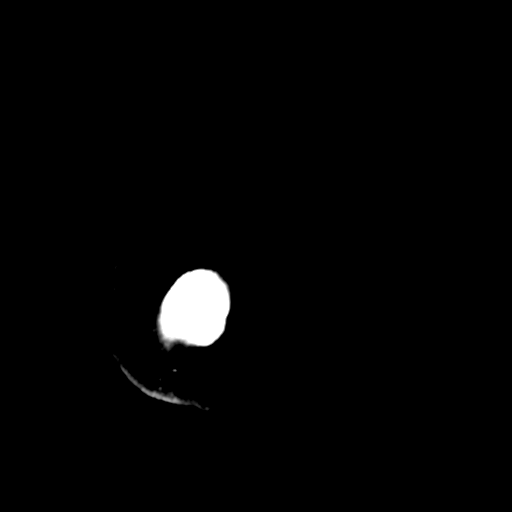
[im 159/169  bone]
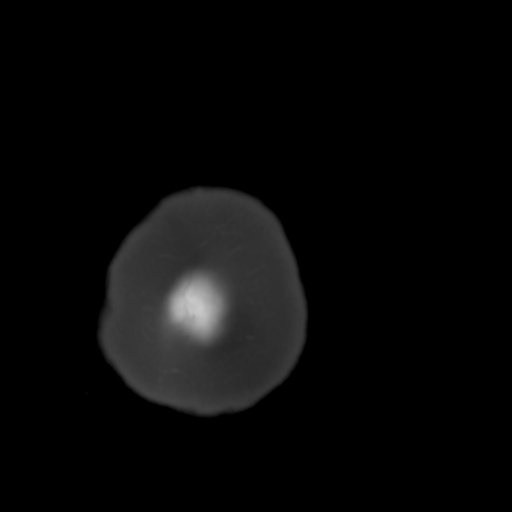

[15 of 47 positions shown; findings below may reference images not displayed]

FINDINGS: The study is limited through the cerebellum and lower brain due to
beam hardening and streak artifact. I spoke to the technologist who
indicated these are the best possible images given patient's morbid
obesity and condition.

Brain: No subdural, epidural, or subarachnoid hemorrhage. The basal
cisterns are patent. Cerebellum is not well evaluated but is grossly
unremarkable. The brainstem is also poorly evaluated without obvious
abnormality. No midline shift. No acute cortical ischemia or
infarct.

Vascular: No hyperdense vessel or unexpected calcification.

Skull: Normal. Negative for fracture or focal lesion.

Sinuses/Orbits: Mucosal thickening is seen in the ethmoid and
sphenoid sinuses with no air-fluid levels. The mastoid air cells and
middle ears are well aerated.

Other: None.
IMPRESSION: 1. Significantly limited study as above with no evidence of
hemorrhage, ischemia, or infarct on provided images.

## 2018-01-23 IMAGING — CR DG CHEST 1V PORT
1 series · 1 of 1 positions shown · non-contrast
Comparison: 06/11/2016

CLINICAL DATA: Short of breath

EXAM:
PORTABLE CHEST 1 VIEW

[AP]
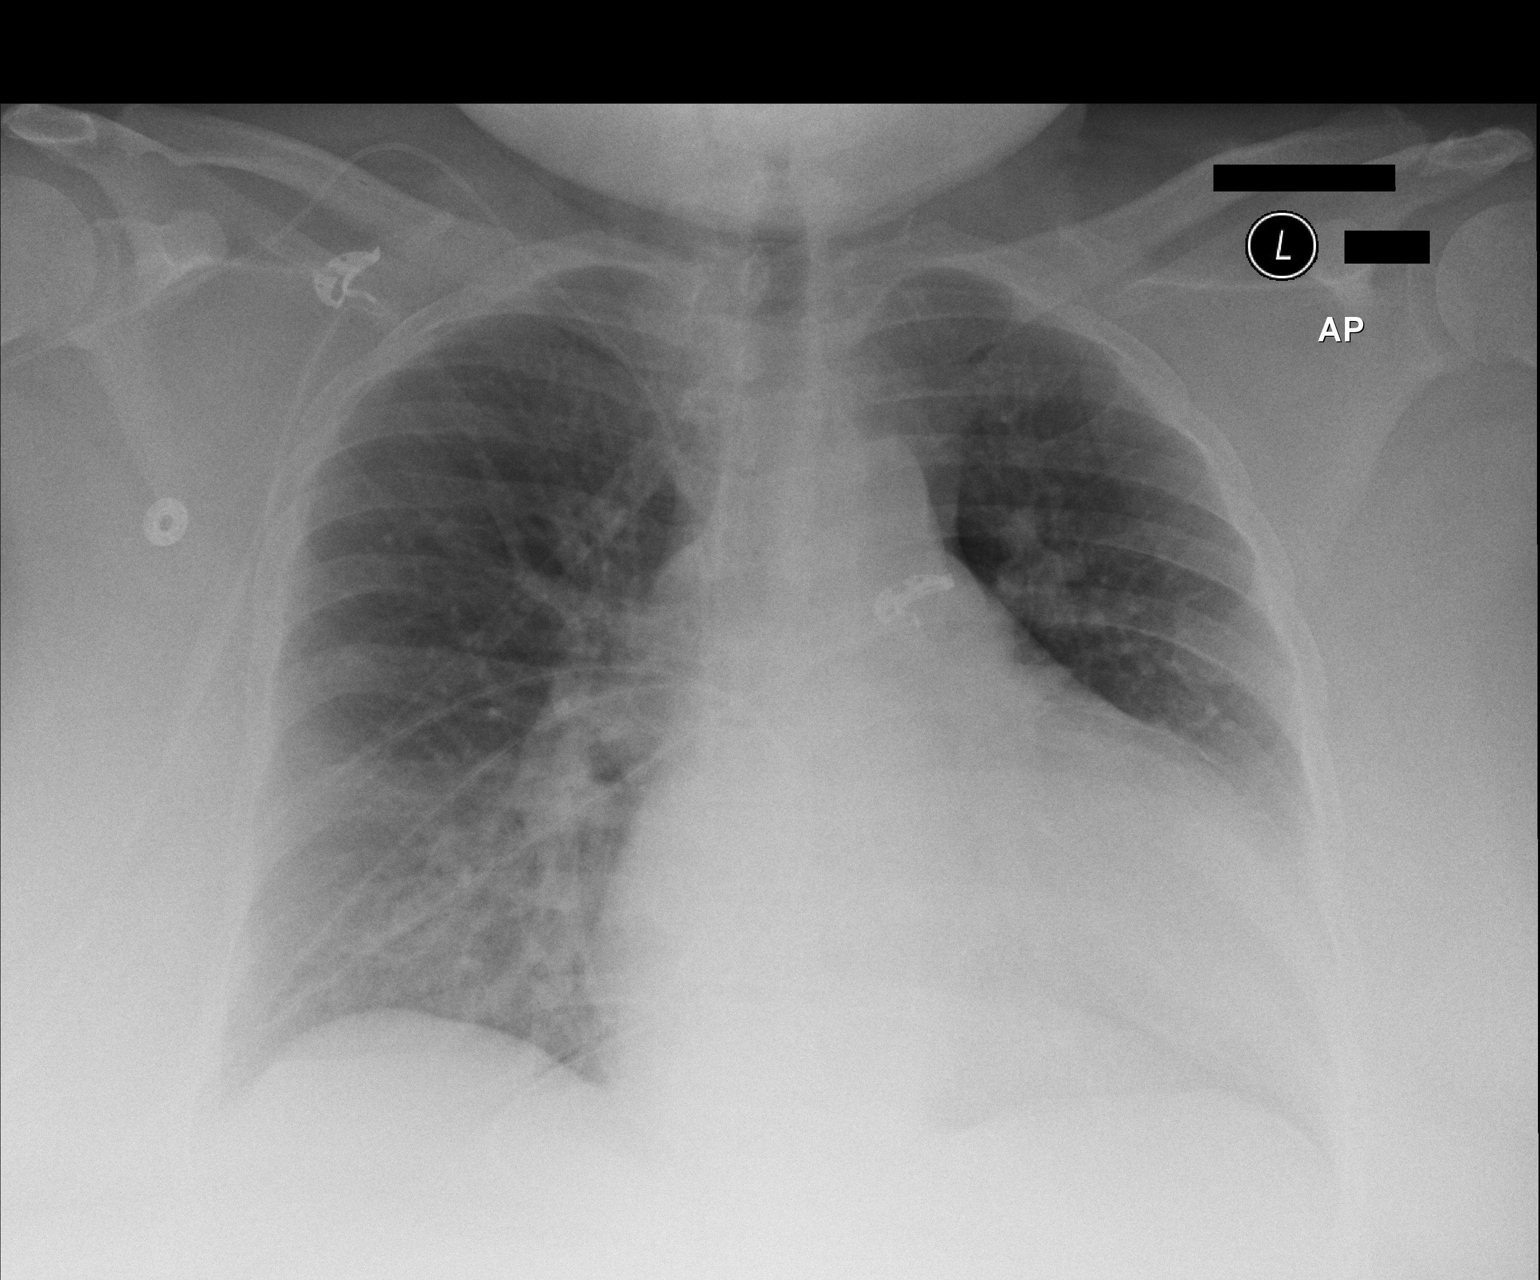

[1 of 1 positions shown; findings below may reference images not displayed]

FINDINGS: Cardiac enlargement. Resolution of pulmonary vascular congestion and
bilateral airspace disease compatible with edema on the prior study.
Currently the lungs are clear without edema or effusion. Negative
for infiltrate

Right arm PICC tip in the SVC.
IMPRESSION: Interval resolution of pulmonary edema.  Lungs are now clear

PICC tip in the SVC.

## 2018-01-24 IMAGING — CR DG CHEST 1V PORT
1 series · 1 of 1 positions shown · non-contrast
Comparison: 06/17/2016 and earlier.

CLINICAL DATA: 60-year-old female with shortness of Breath.
Admitted with multifactorial acute on chronic respiratory failure.

EXAM:
PORTABLE CHEST 1 VIEW

[ap]
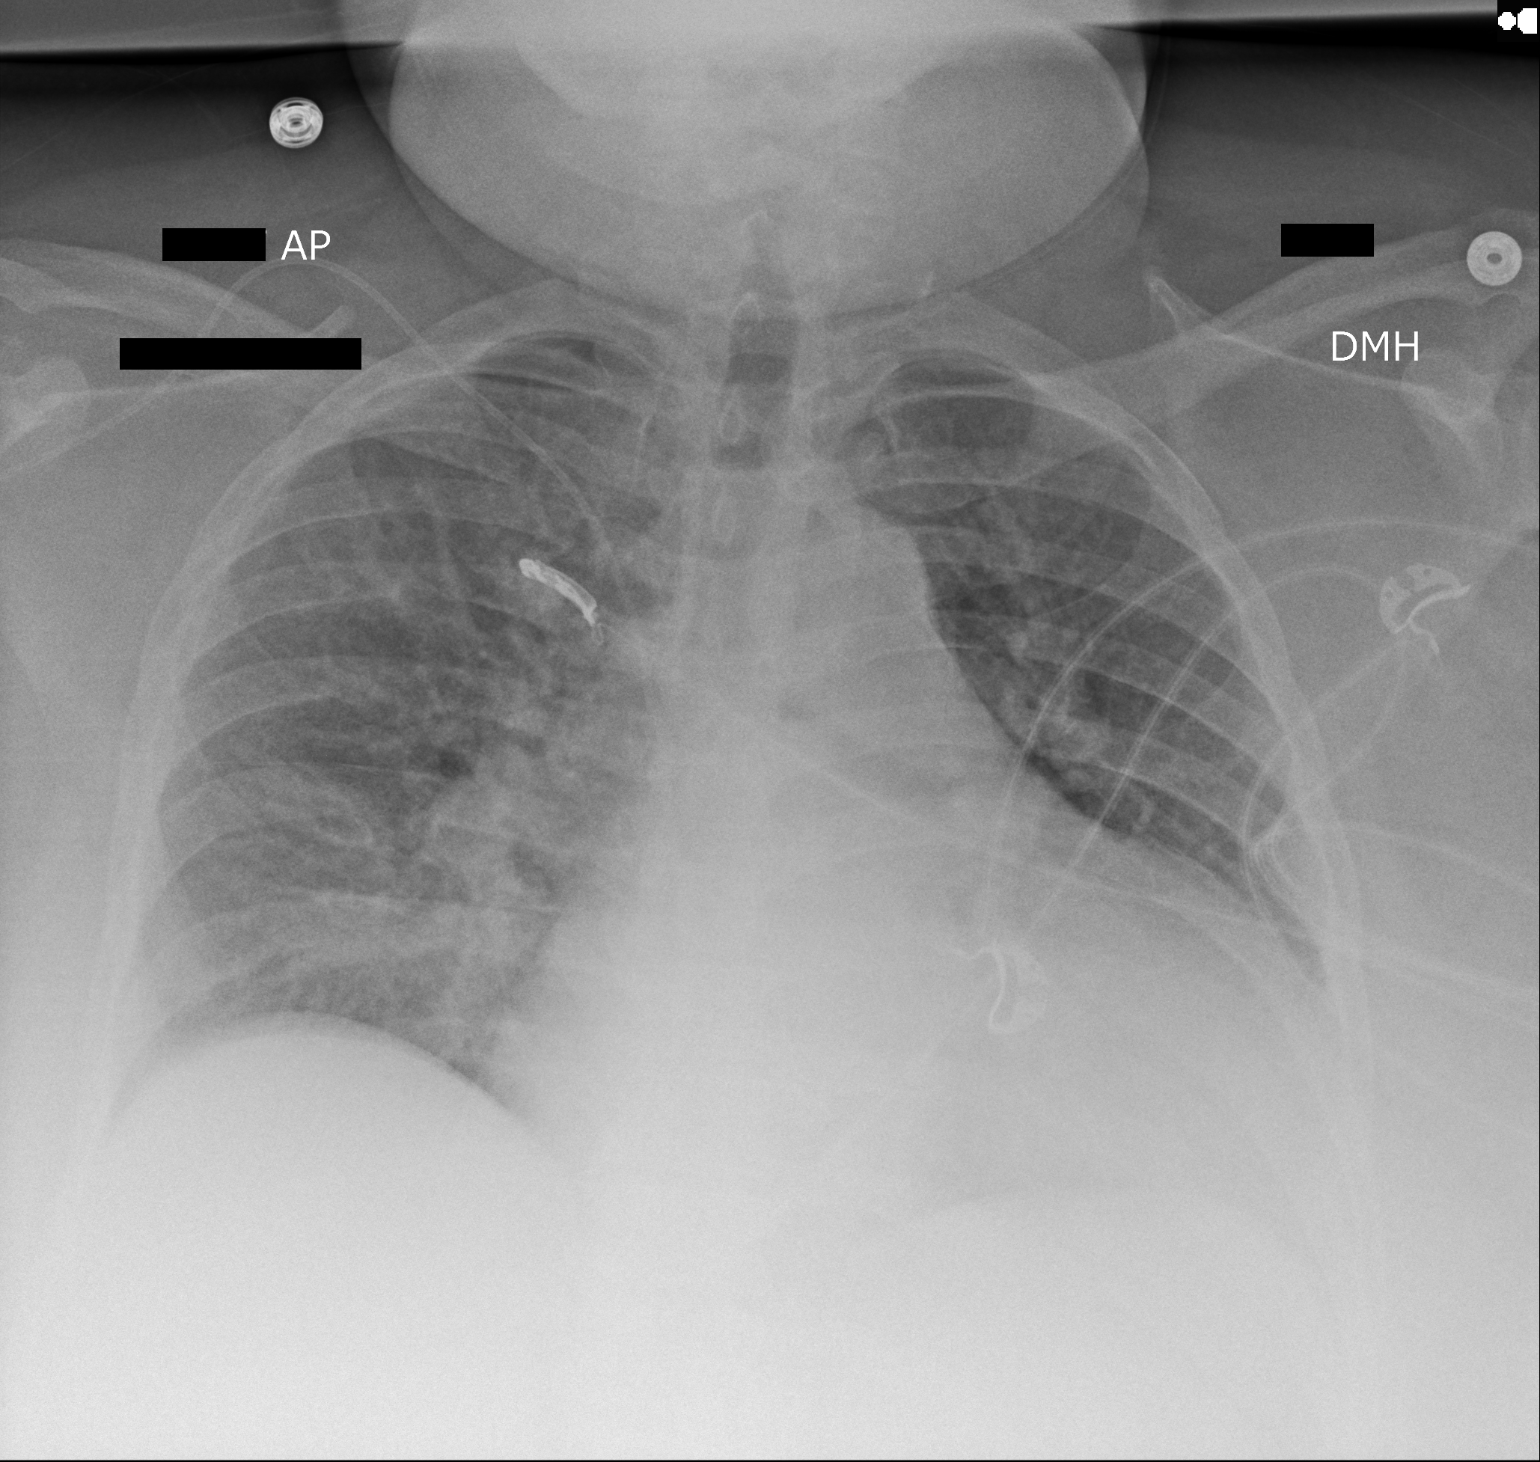

[1 of 1 positions shown; findings below may reference images not displayed]

FINDINGS: Portable AP semi upright view at 0000 hours. Stable cardiomegaly and
mediastinal contours. Mildly increased pulmonary vascularity since
yesterday but no overt edema. Mildly lower lung volumes. No
pneumothorax. No pleural effusion or consolidation identified.
Visualized tracheal air column is within normal limits.
IMPRESSION: Mildly lower lung volumes and increased pulmonary vascularity since
yesterday but overall no acute cardiopulmonary abnormality.

Stable cardiomegaly.

## 2018-01-25 IMAGING — CR DG CHEST 1V PORT
1 series · 1 of 1 positions shown · non-contrast
Comparison: 06/18/2016

CLINICAL DATA: 60-year-old female with a history of dyspnea

EXAM:
PORTABLE CHEST 1 VIEW

[AP]
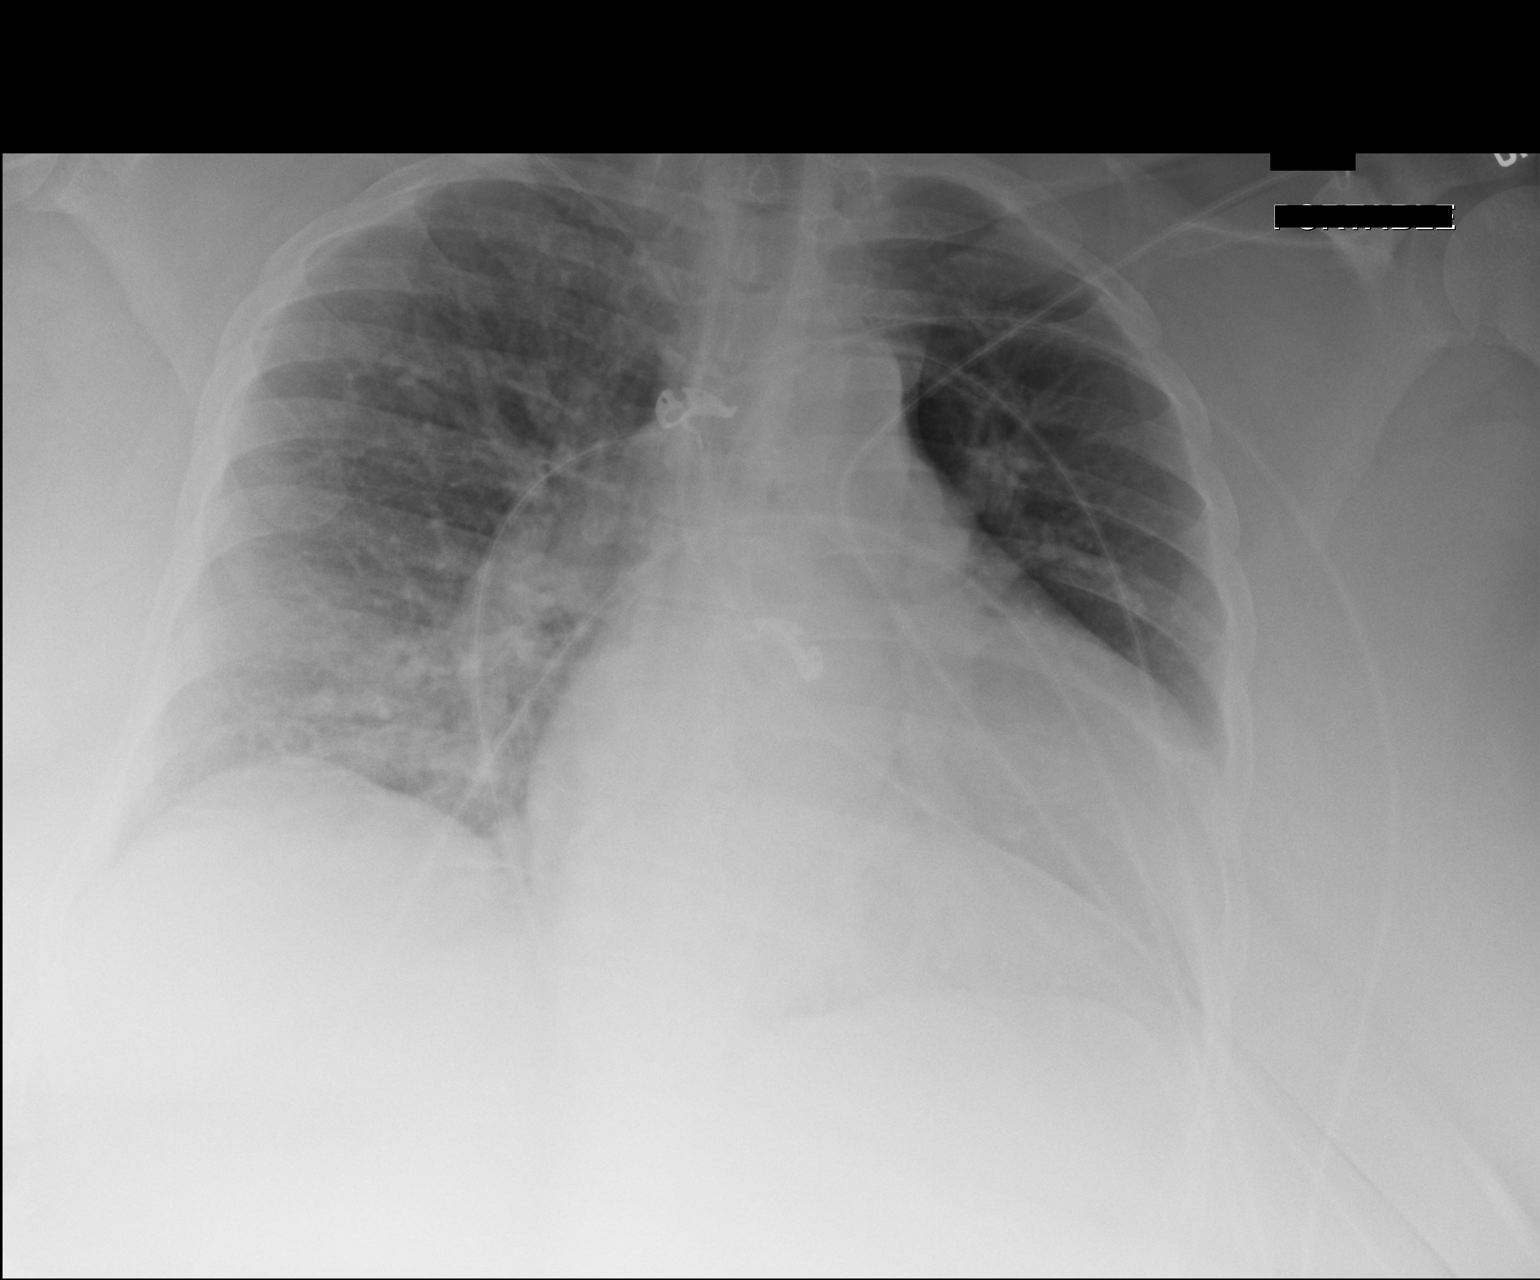

[1 of 1 positions shown; findings below may reference images not displayed]

FINDINGS: Cardiomediastinal silhouette unchanged with cardiomegaly.

Opacity in the right lung base, more pronounced from the comparison.
Lower lung volumes compared to the prior.

No pneumothorax.

No displaced fracture.
IMPRESSION: Increasing opacity at the right lung base, may reflect developing
infection given the history. Edema and small pleural effusions not
excluded. Correlation with lab values may be useful.

Re- demonstration of cardiomegaly.
